# Patient Record
Sex: Female | Born: 1975 | Race: Black or African American | Hispanic: No | Smoking: Never smoker
Health system: Southern US, Community
[De-identification: ages and names within clinical notes are randomized; demographics above are authoritative.]

## PROBLEM LIST (undated history)

## (undated) DIAGNOSIS — J189 Pneumonia, unspecified organism: Secondary | ICD-10-CM

## (undated) DIAGNOSIS — I10 Essential (primary) hypertension: Secondary | ICD-10-CM

## (undated) DIAGNOSIS — J45909 Unspecified asthma, uncomplicated: Secondary | ICD-10-CM

## (undated) HISTORY — DX: Pneumonia, unspecified organism: J18.9

## (undated) HISTORY — DX: Unspecified asthma, uncomplicated: J45.909

## (undated) HISTORY — PX: TUBAL LIGATION: SHX77

---

## 2011-05-07 ENCOUNTER — Encounter (HOSPITAL_BASED_OUTPATIENT_CLINIC_OR_DEPARTMENT_OTHER): Payer: Self-pay | Admitting: *Deleted

## 2011-05-07 ENCOUNTER — Emergency Department (HOSPITAL_BASED_OUTPATIENT_CLINIC_OR_DEPARTMENT_OTHER)
Admission: EM | Admit: 2011-05-07 | Discharge: 2011-05-07 | Disposition: A | Discharge status: Home / Self Care | Payer: Self-pay | Attending: Emergency Medicine | Admitting: Emergency Medicine

## 2011-05-07 DIAGNOSIS — H538 Other visual disturbances: Secondary | ICD-10-CM | POA: Insufficient documentation

## 2011-05-07 DIAGNOSIS — R209 Unspecified disturbances of skin sensation: Secondary | ICD-10-CM | POA: Insufficient documentation

## 2011-05-07 DIAGNOSIS — R51 Headache: Secondary | ICD-10-CM | POA: Insufficient documentation

## 2011-05-07 DIAGNOSIS — I10 Essential (primary) hypertension: Secondary | ICD-10-CM | POA: Insufficient documentation

## 2011-05-07 HISTORY — DX: Essential (primary) hypertension: I10

## 2011-05-07 MED ORDER — HYDROCHLOROTHIAZIDE 25 MG PO TABS: 25.0000 mg | ORAL_TABLET | Freq: Every day | ORAL | Status: DC

## 2011-05-07 MED ORDER — HYDROCHLOROTHIAZIDE 25 MG PO TABS
25.0000 mg | ORAL_TABLET | Freq: Once | ORAL | Status: AC
  Administered 2011-05-07: 25 mg via ORAL
  Filled 2011-05-07: qty 1

## 2011-05-07 NOTE — ED Notes (Signed)
Pt states that her right arm and hand are "kinda tingly" and she has a headache and her vision "is a tad blurry" states that she took her BP at the pharmacy and it was elevated

## 2011-05-07 NOTE — ED Notes (Signed)
rx x 1 given for HCTZ- d/c with friend

## 2011-05-07 NOTE — Discharge Instructions (Signed)
Hypertension Information As your heart beats, it forces blood through your arteries. This force is your blood pressure. If the pressure is too high, it is called hypertension (HTN) or high blood pressure. HTN is dangerous because you may have it and not know it. High blood pressure may mean that your heart has to work harder to pump blood. Your arteries may be narrow or stiff. The extra work puts you at risk for heart disease, stroke, and other problems.  Blood pressure consists of two numbers, a higher number over a lower, 110/72, for example. It is stated as "110 over 72." The ideal is below 120 for the top number (systolic) and under 80 for the bottom (diastolic).  You should pay close attention to your blood pressure if you have certain conditions such as:  Heart failure.   Prior heart attack.   Diabetes   Chronic kidney disease.   Prior stroke.   Multiple risk factors for heart disease.  To see if you have HTN, your blood pressure should be measured while you are seated with your arm held at the level of the heart. It should be measured at least twice. A one-time elevated blood pressure reading (especially in the Emergency Department) does not mean that you need treatment. There may be conditions in which the blood pressure is different between your right and left arms. It is important to see your caregiver soon for a recheck. Most people have essential hypertension which means that there is not a specific cause. This type of high blood pressure may be lowered by changing lifestyle factors such as:  Stress.   Smoking.   Lack of exercise.   Excessive weight.   Drug/tobacco/alcohol use.   Eating less salt.  Most people do not have symptoms from high blood pressure until it has caused damage to the body. Effective treatment can often prevent, delay or reduce that damage. TREATMENT  Treatment for high blood pressure, when a cause has been identified, is directed at the cause. There  are a large number of medications to treat HTN. These fall into several categories, and your caregiver will help you select the medicines that are best for you. Medications may have side effects. You should review side effects with your caregiver. If your blood pressure stays high after you have made lifestyle changes or started on medicines,   Your medication(s) may need to be changed.   Other problems may need to be addressed.   Be certain you understand your prescriptions, and know how and when to take your medicine.   Be sure to follow up with your caregiver within the time frame advised (usually within two weeks) to have your blood pressure rechecked and to review your medications.   If you are taking more than one medicine to lower your blood pressure, make sure you know how and at what times they should be taken. Taking two medicines at the same time can result in blood pressure that is too low.  Document Released: 04/19/2005 Document Revised: 10/27/2010 Document Reviewed: 04/26/2007 ExitCare Patient Information 2012 [employer]. 

## 2011-05-07 NOTE — ED Provider Notes (Signed)
This chart was scribed for [name], MD by Wallis Mart. The patient was seen in room MH10/MH10 and the patient's care was started at 10:06 PM.   CSN: [id number]  Arrival date & time 05/07/11  2102   First MD Initiated Contact with Patient 05/07/11 2147      Chief Complaint  Patient presents with  . Blurred Vision  . Headache  . Hypertension    (Consider location/radiation/quality/duration/timing/severity/associated sxs/prior treatment) HPI   Nicole Larson is a 36 y.o. female who presents to the Emergency Department complaining of sudden onset, persistence of constant Hypertension . BP is currently 180/103.  Pt states that her hands started feeling tingly and her vision was blurry and she went to [facility] to check her blood pressure and it was elevated.  Pt c/o associated headache.   Pt was on blood pressure medicine a year ago but stopped because she lost her insurance.  Pt denies fever, nausea, throwing up.  There are no other associated symptoms and no other alleviating or aggravating factors.  Past Medical History  Diagnosis Date  . Hypertension     Past Surgical History  Procedure Date  . Tubal ligation     History reviewed. No pertinent family history.  History  Substance Use Topics  . Smoking status: Never Smoker   . Smokeless tobacco: Not on file  . Alcohol Use: No    OB History    Grav Para Term Preterm Abortions TAB SAB Ect Mult Living                  Review of Systems  Constitutional: Negative for fever and chills.  HENT: Negative for rhinorrhea.   Eyes: Positive for visual disturbance. Negative for pain.  Respiratory: Negative for cough and shortness of breath.   Cardiovascular: Negative for chest pain.  Gastrointestinal: Negative for nausea, vomiting, abdominal pain and diarrhea.  Genitourinary: Negative for dysuria.  Musculoskeletal: Negative for back pain.  Skin: Negative for rash.  Neurological: Positive for headaches. Negative for  weakness.  All other systems reviewed and are negative.    Allergies  Review of patient's allergies indicates no known allergies.  Home Medications   Current Outpatient Rx  Name Route Sig Dispense Refill  . CLOTRIMAZOLE 1 % EX CREA Topical Apply 1 application topically daily.    Marland Kitchen OVER THE COUNTER MEDICATION Oral Take 1 scoop by mouth daily. [employer] supplement      BP 180/103  Pulse 107  Temp 99.4 F (37.4 C)  Resp 16  LMP 04/30/2011  Physical Exam  Nursing note and vitals reviewed. Constitutional: She is oriented to person, place, and time. She appears well-developed and well-nourished. No distress.  HENT:  Head: Normocephalic and atraumatic.  Eyes: EOM are normal. Pupils are equal, round, and reactive to light.  Neck: Normal range of motion. Neck supple. No tracheal deviation present.  Cardiovascular: Normal rate and regular rhythm.   Pulmonary/Chest: Effort normal and breath sounds normal. No respiratory distress.  Abdominal: Soft. Bowel sounds are normal. She exhibits no distension.  Musculoskeletal: Normal range of motion. She exhibits no edema.  Neurological: She is alert and oriented to person, place, and time. No sensory deficit.       Cranial nerves iii-XII in-tact, extra olcular eye movements in-tact, face is symmetric, tongue is midline, visual fields in-tact, noormal finger nose testing, no pronator drift, normal leg strength bilaterally, normal heel to shin testing, speech clear, normal gait  Skin: Skin is warm and dry.  Psychiatric: She has a normal mood and affect. Her behavior is normal.    ED Course  Procedures (including critical care time) DIAGNOSTIC STUDIES:   COORDINATION OF CARE:    Labs Reviewed - No data to display No results found.   No diagnosis found.    MDM  Patient with no focal neurologic deficits or other signs of clinical end organ damage.  Patient is hypertensive here and was previously on any antihypertensive  medication but had ceased that when she lost her medical insurance.  I will restart the patient on hydrochlorothiazide 25 mg by mouth daily and she understands to followup in approximately one month's time with a new primary care physician.  Patient states she started new job and her insurance will kick in the next month and she'll be able to do so.  I've given her references for physicians here in this building.  She understands she may still need further blood pressure medications added to her regimen that will necessitate her followup in approximately one month's time.  I personally performed the services described in this documentation, which was scribed in my presence. The recorded information has been reviewed and considered.       [name], MD 05/07/11 2211

## 2011-06-09 ENCOUNTER — Encounter (HOSPITAL_BASED_OUTPATIENT_CLINIC_OR_DEPARTMENT_OTHER): Payer: Self-pay | Admitting: *Deleted

## 2011-06-09 ENCOUNTER — Emergency Department (HOSPITAL_BASED_OUTPATIENT_CLINIC_OR_DEPARTMENT_OTHER)
Admission: EM | Admit: 2011-06-09 | Discharge: 2011-06-09 | Disposition: A | Discharge status: Home / Self Care | Payer: Self-pay | Attending: Emergency Medicine | Admitting: Emergency Medicine

## 2011-06-09 DIAGNOSIS — N390 Urinary tract infection, site not specified: Secondary | ICD-10-CM | POA: Insufficient documentation

## 2011-06-09 DIAGNOSIS — A499 Bacterial infection, unspecified: Secondary | ICD-10-CM | POA: Insufficient documentation

## 2011-06-09 DIAGNOSIS — I10 Essential (primary) hypertension: Secondary | ICD-10-CM | POA: Insufficient documentation

## 2011-06-09 DIAGNOSIS — N898 Other specified noninflammatory disorders of vagina: Secondary | ICD-10-CM | POA: Insufficient documentation

## 2011-06-09 DIAGNOSIS — B9689 Other specified bacterial agents as the cause of diseases classified elsewhere: Secondary | ICD-10-CM | POA: Insufficient documentation

## 2011-06-09 DIAGNOSIS — N949 Unspecified condition associated with female genital organs and menstrual cycle: Secondary | ICD-10-CM | POA: Insufficient documentation

## 2011-06-09 DIAGNOSIS — N760 Acute vaginitis: Secondary | ICD-10-CM | POA: Insufficient documentation

## 2011-06-09 LAB — RPR: RPR Ser Ql: NONREACTIVE

## 2011-06-09 LAB — URINALYSIS, ROUTINE W REFLEX MICROSCOPIC
Glucose, UA: NEGATIVE mg/dL
Specific Gravity, Urine: 1.045 — ABNORMAL HIGH (ref 1.005–1.030)
Urobilinogen, UA: 1 mg/dL (ref 0.0–1.0)

## 2011-06-09 LAB — URINE MICROSCOPIC-ADD ON

## 2011-06-09 LAB — WET PREP, GENITAL
Trich, Wet Prep: NONE SEEN
Yeast Wet Prep HPF POC: NONE SEEN

## 2011-06-09 MED ORDER — METRONIDAZOLE 500 MG PO TABS: 500.0000 mg | ORAL_TABLET | Freq: Two times a day (BID) | ORAL | Status: AC

## 2011-06-09 MED ORDER — DOXYCYCLINE HYCLATE 100 MG PO CAPS: 100.0000 mg | ORAL_CAPSULE | Freq: Two times a day (BID) | ORAL | Status: AC

## 2011-06-09 NOTE — Discharge Instructions (Signed)
Bacterial Vaginosis Bacterial vaginosis (BV) is a vaginal infection where the normal balance of bacteria in the vagina is disrupted. The normal balance is then replaced by an overgrowth of certain bacteria. There are several different kinds of bacteria that can cause BV. BV is the most common vaginal infection in women of childbearing age. CAUSES   The cause of BV is not fully understood. BV develops when there is an increase or imbalance of harmful bacteria.   Some activities or behaviors can upset the normal balance of bacteria in the vagina and put women at increased risk including:   Having a new sex partner or multiple sex partners.   Douching.   Using an intrauterine device (IUD) for contraception.   It is not clear what role sexual activity plays in the development of BV. However, women that have never had sexual intercourse are rarely infected with BV.  Women do not get BV from toilet seats, bedding, swimming pools or from touching objects around them.  SYMPTOMS   Grey vaginal discharge.   A fish-like odor with discharge, especially after sexual intercourse.   Itching or burning of the vagina and vulva.   Burning or pain with urination.   Some women have no signs or symptoms at all.  DIAGNOSIS  Your caregiver must examine the vagina for signs of BV. Your caregiver will perform lab tests and look at the sample of vaginal fluid through a microscope. They will look for bacteria and abnormal cells (clue cells), a pH test higher than 4.5, and a positive amine test all associated with BV.  RISKS AND COMPLICATIONS   Pelvic inflammatory disease (PID).   Infections following gynecology surgery.   Developing HIV.   Developing herpes virus.  TREATMENT  Sometimes BV will clear up without treatment. However, all women with symptoms of BV should be treated to avoid complications, especially if gynecology surgery is planned. Female partners generally do not need to be treated. However,  BV may spread between female sex partners so treatment is helpful in preventing a recurrence of BV.   BV may be treated with antibiotics. The antibiotics come in either pill or vaginal cream forms. Either can be used with nonpregnant or pregnant women, but the recommended dosages differ. These antibiotics are not harmful to the baby.   BV can recur after treatment. If this happens, a second round of antibiotics will often be prescribed.   Treatment is important for pregnant women. If not treated, BV can cause a premature delivery, especially for a pregnant woman who had a premature birth in the past. All pregnant women who have symptoms of BV should be checked and treated.   For chronic reoccurrence of BV, treatment with a type of prescribed gel vaginally twice a week is helpful.  HOME CARE INSTRUCTIONS   Finish all medication as directed by your caregiver.   Do not have sex until treatment is completed.   Tell your sexual partner that you have a vaginal infection. They should see their caregiver and be treated if they have problems, such as a mild rash or itching.   Practice safe sex. Use condoms. Only have 1 sex partner.  PREVENTION  Basic prevention steps can help reduce the risk of upsetting the natural balance of bacteria in the vagina and developing BV:  Do not have sexual intercourse (be abstinent).   Do not douche.   Use all of the medicine prescribed for treatment of BV, even if the signs and symptoms go away.     Tell your sex partner if you have BV. That way, they can be treated, if needed, to prevent reoccurrence.  SEEK MEDICAL CARE IF:   Your symptoms are not improving after 3 days of treatment.   You have increased discharge, pain, or fever.  MAKE SURE YOU:   Understand these instructions.   Will watch your condition.   Will get help right away if you are not doing well or get worse.  FOR MORE INFORMATION  Division of STD Prevention (DSTDP), Centers for Disease  Control and Prevention: www.cdc.gov/std American Social Health Association (ASHA): www.ashastd.org  Document Released: 02/14/2005 Document Revised: 02/03/2011 Document Reviewed: 08/07/2008 ExitCare Patient Information 2012 [employer].Urinary Tract Infection Infections of the urinary tract can start in several places. A bladder infection (cystitis), a kidney infection (pyelonephritis), and a prostate infection (prostatitis) are different types of urinary tract infections (UTIs). They usually get better if treated with medicines (antibiotics) that kill germs. Take all the medicine until it is gone. You or your child may feel better in a few days, but TAKE ALL MEDICINE or the infection may not respond and may become more difficult to treat. HOME CARE INSTRUCTIONS   Drink enough water and fluids to keep the urine clear or pale yellow. Cranberry juice is especially recommended, in addition to large amounts of water.   Avoid caffeine, tea, and carbonated beverages. They tend to irritate the bladder.   Alcohol may irritate the prostate.   Only take over-the-counter or prescription medicines for pain, discomfort, or fever as directed by your caregiver.  To prevent further infections:  Empty the bladder often. Avoid holding urine for long periods of time.   After a bowel movement, women should cleanse from front to back. Use each tissue only once.   Empty the bladder before and after sexual intercourse.  FINDING OUT THE RESULTS OF YOUR TEST Not all test results are available during your visit. If your or your child's test results are not back during the visit, make an appointment with your caregiver to find out the results. Do not assume everything is normal if you have not heard from your caregiver or the medical facility. It is important for you to follow up on all test results. SEEK MEDICAL CARE IF:   There is back pain.   Your baby is older than 3 months with a rectal temperature of 100.5  F (38.1 C) or higher for more than 1 day.   Your or your child's problems (symptoms) are no better in 3 days. Return sooner if you or your child is getting worse.  SEEK IMMEDIATE MEDICAL CARE IF:   There is severe back pain or lower abdominal pain.   You or your child develops chills.   You have a fever.   Your baby is older than 3 months with a rectal temperature of 102 F (38.9 C) or higher.   Your baby is 3 months old or younger with a rectal temperature of 100.4 F (38 C) or higher.   There is nausea or vomiting.   There is continued burning or discomfort with urination.  MAKE SURE YOU:   Understand these instructions.   Will watch your condition.   Will get help right away if you are not doing well or get worse.  Document Released: 11/24/2004 Document Revised: 02/03/2011 Document Reviewed: 06/29/2006 ExitCare Patient Information 2012 [employer]. 

## 2011-06-09 NOTE — ED Notes (Signed)
Pt states she has a sore on her "privates". She describes a possible abscess between her posterior fourchette and her anus. States it started as a pimple that was sore and has opened, drained and is now an open area. She is tearful and states she has never been unfaithful to her husband but thinks she has an STD. Denies vaginal discharge. Also is concerned that she works and makes to much money to be on [employer] but cannot get into a GYN for exams due to no insurance and she has not had a pelvic exam in 6 years. The health department does not see woman who have had their tubes tied.

## 2011-06-09 NOTE — ED Provider Notes (Signed)
Medical screening examination/treatment/procedure(s) were performed by non-physician practitioner and as supervising physician I was immediately available for consultation/collaboration.   Lyanne Co, MD 06/09/11 [phone]

## 2011-06-09 NOTE — ED Provider Notes (Signed)
History     CSN: [id number]  Arrival date & time 06/09/11  1159   First MD Initiated Contact with Patient 06/09/11 1214      Chief Complaint  Patient presents with  . Abscess    (Consider location/radiation/quality/duration/timing/severity/associated sxs/prior treatment) Patient is a 36 y.o. female presenting with vaginal discharge. The history is provided by the patient. No language interpreter was used.  Vaginal Discharge This is a new problem. The current episode started today. The problem occurs constantly. The problem has been gradually worsening. Associated symptoms comments: Vaginal pain. The symptoms are aggravated by nothing. She has tried nothing for the symptoms. The treatment provided moderate relief.  Pt complains of a sore on inner labia,  Pt reports area is very painful  Past Medical History  Diagnosis Date  . Hypertension     Past Surgical History  Procedure Date  . Tubal ligation     No family history on file.  History  Substance Use Topics  . Smoking status: Never Smoker   . Smokeless tobacco: Not on file  . Alcohol Use: No    OB History    Grav Para Term Preterm Abortions TAB SAB Ect Mult Living                  Review of Systems  Genitourinary: Positive for vaginal discharge.  All other systems reviewed and are negative.    Allergies  Review of patient's allergies indicates no known allergies.  Home Medications   Current Outpatient Rx  Name Route Sig Dispense Refill  . CLOTRIMAZOLE 1 % EX CREA Topical Apply 1 application topically daily.    Marland Kitchen HYDROCHLOROTHIAZIDE 25 MG PO TABS Oral Take 1 tablet (25 mg total) by mouth daily. 30 tablet 0  . OVER THE COUNTER MEDICATION Oral Take 1 scoop by mouth daily. [employer] supplement      BP 145/95  Pulse 94  Temp(Src) 98.2 F (36.8 C) (Oral)  Resp 20  SpO2 99%  LMP 05/23/2011  Physical Exam  Nursing note and vitals reviewed. Constitutional: She is oriented to person, place, and  time. She appears well-developed and well-nourished.  HENT:  Head: Normocephalic.  Eyes: Conjunctivae are normal. Pupils are equal, round, and reactive to light.  Neck: Normal range of motion.  Cardiovascular: Normal rate and normal heart sounds.   Pulmonary/Chest: Effort normal.  Abdominal: Soft.  Genitourinary: Vaginal discharge found.  Musculoskeletal: Normal range of motion.       Pink lesion left labia,  Painful to touch  Neurological: She is alert and oriented to person, place, and time. She has normal reflexes.  Skin: Skin is warm.  Psychiatric: She has a normal mood and affect.    ED Course  Procedures (including critical care time)   Labs Reviewed  URINALYSIS, ROUTINE W REFLEX MICROSCOPIC  WET PREP, GENITAL  GC/CHLAMYDIA PROBE AMP, GENITAL  HERPES SIMPLEX VIRUS CULTURE  RPR   No results found.   No diagnosis found.    MDM  Pt covered for uti and bv.  I will use flagyl and doxy.   I advised pt she needs gyn follow up       Elson Areas, [place] 06/09/11 1412

## 2011-06-10 LAB — GC/CHLAMYDIA PROBE AMP, GENITAL: GC Probe Amp, Genital: NEGATIVE

## 2011-06-13 LAB — HERPES SIMPLEX VIRUS CULTURE: Culture: DETECTED

## 2011-06-14 NOTE — ED Notes (Signed)
+  Herpes. Chart sent to EDP office for review. °

## 2011-06-16 NOTE — ED Notes (Signed)
Chart back from EDP office; no answer

## 2011-06-19 NOTE — ED Notes (Signed)
Chart back from EDP office. Herpes Simplex type II detected. Recommend Valtrex 500mg  PO BID x 3 days IF patient has prior RX of herpes. Reviewed/Prescribed by Fayrene Helper PA-C.

## 2011-06-19 NOTE — ED Notes (Signed)
Patient notified of positive result, after ID verified x two, STD instructions given. RX Valtre 500 mg PO BID x three days, prescriber Fayrene Helper PA, called to [facility] [phone].

## 2013-05-02 ENCOUNTER — Encounter (HOSPITAL_BASED_OUTPATIENT_CLINIC_OR_DEPARTMENT_OTHER): Payer: Self-pay | Admitting: Emergency Medicine

## 2013-05-02 ENCOUNTER — Emergency Department (HOSPITAL_BASED_OUTPATIENT_CLINIC_OR_DEPARTMENT_OTHER)
Admission: EM | Admit: 2013-05-02 | Discharge: 2013-05-02 | Disposition: A | Discharge status: Home / Self Care | Payer: BC Managed Care – PPO | Attending: Emergency Medicine | Admitting: Emergency Medicine

## 2013-05-02 DIAGNOSIS — K006 Disturbances in tooth eruption: Secondary | ICD-10-CM | POA: Insufficient documentation

## 2013-05-02 DIAGNOSIS — K029 Dental caries, unspecified: Secondary | ICD-10-CM | POA: Insufficient documentation

## 2013-05-02 DIAGNOSIS — Z79899 Other long term (current) drug therapy: Secondary | ICD-10-CM | POA: Insufficient documentation

## 2013-05-02 DIAGNOSIS — I10 Essential (primary) hypertension: Secondary | ICD-10-CM | POA: Insufficient documentation

## 2013-05-02 MED ORDER — PENICILLIN V POTASSIUM 500 MG PO TABS: 500.0000 mg | ORAL_TABLET | Freq: Four times a day (QID) | ORAL | Status: DC

## 2013-05-02 MED ORDER — HYDROCODONE-ACETAMINOPHEN 5-325 MG PO TABS: 1.0000 | ORAL_TABLET | Freq: Four times a day (QID) | ORAL | Status: DC | PRN

## 2013-05-02 MED ORDER — PENICILLIN V POTASSIUM 250 MG PO TABS
500.0000 mg | ORAL_TABLET | Freq: Once | ORAL | Status: AC
  Administered 2013-05-02: 500 mg via ORAL
  Filled 2013-05-02: qty 2

## 2013-05-02 NOTE — ED Notes (Signed)
C/o pain to top left back tooth since yesterday

## 2013-05-02 NOTE — Discharge Instructions (Signed)
Dental Care and Dentist Visits Dental care supports good overall health. Regular dental visits can also help you avoid dental pain, bleeding, infection, and other more serious health problems in the future. It is important to keep the mouth healthy because diseases in the teeth, gums, and other oral tissues can spread to other areas of the body. Some problems, such as diabetes, heart disease, and pre-term labor have been associated with poor oral health.  See your dentist every 6 months. If you experience emergency problems such as a toothache or broken tooth, go to the dentist right away. If you see your dentist regularly, you may catch problems early. It is easier to be treated for problems in the early stages.  WHAT TO EXPECT AT A DENTIST VISIT  Your dentist will look for many common oral health problems and recommend proper treatment. At your regular dental visit, you can expect:  Gentle cleaning of the teeth and gums. This includes scraping and polishing. This helps to remove the sticky substance around the teeth and gums (plaque). Plaque forms in the mouth shortly after eating. Over time, plaque hardens on the teeth as tartar. If tartar is not removed regularly, it can cause problems. Cleaning also helps remove stains.  Periodic X-rays. These pictures of the teeth and supporting bone will help your dentist assess the health of your teeth.  Periodic fluoride treatments. Fluoride is a natural mineral shown to help strengthen teeth. Fluoride treatmentinvolves applying a fluoride gel or varnish to the teeth. It is most commonly done in children.  Examination of the mouth, tongue, jaws, teeth, and gums to look for any oral health problems, such as:  Cavities (dental caries). This is decay on the tooth caused by plaque, sugar, and acid in the mouth. It is best to catch a cavity when it is small.  Inflammation of the gums caused by plaque buildup (gingivitis).  Problems with the mouth or malformed  or misaligned teeth.  Oral cancer or other diseases of the soft tissues or jaws. KEEP YOUR TEETH AND GUMS HEALTHY For healthy teeth and gums, follow these general guidelines as well as your dentist's specific advice:  Have your teeth professionally cleaned at the dentist every 6 months.  Brush twice daily with a fluoride toothpaste.  Floss your teeth daily.  Ask your dentist if you need fluoride supplements, treatments, or fluoride toothpaste.  Eat a healthy diet. Reduce foods and drinks with added sugar.  Avoid smoking. TREATMENT FOR ORAL HEALTH PROBLEMS If you have oral health problems, treatment varies depending on the conditions present in your teeth and gums.  Your caregiver will most likely recommend good oral hygiene at each visit.  For cavities, gingivitis, or other oral health disease, your caregiver will perform a procedure to treat the problem. This is typically done at a separate appointment. Sometimes your caregiver will refer you to another dental specialist for specific tooth problems or for surgery. SEEK IMMEDIATE DENTAL CARE IF:  You have pain, bleeding, or soreness in the gum, tooth, jaw, or mouth area.  A permanent tooth becomes loose or separated from the gum socket.  You experience a blow or injury to the mouth or jaw area. Document Released: 10/27/2010 Document Revised: 05/09/2011 Document Reviewed: 10/27/2010 ExitCare Patient Information 2014 [employer].  

## 2013-05-02 NOTE — ED Provider Notes (Signed)
CSN: [id number]     Arrival date & time 05/02/13  7116 History   None    Chief Complaint  Patient presents with  . Dental Pain     (Consider location/radiation/quality/duration/timing/severity/associated sxs/prior Treatment) Patient is a 38 y.o. female presenting with tooth pain. The history is provided by the patient.  Dental Pain Location:  Upper Upper teeth location:  14/LU 1st molar Quality:  Dull Severity:  Severe Duration:  1 day Timing:  Constant Progression:  Unchanged Chronicity:  New Context: poor dentition   Previous work-up:  Dental exam Relieved by:  Nothing Worsened by:  Nothing tried Ineffective treatments:  None tried Associated symptoms: no fever and no neck swelling   Risk factors: no smoking     Past Medical History  Diagnosis Date  . Hypertension    Past Surgical History  Procedure Laterality Date  . Tubal ligation     History reviewed. No pertinent family history. History  Substance Use Topics  . Smoking status: Never Smoker   . Smokeless tobacco: Not on file  . Alcohol Use: No   OB History   Grav Para Term Preterm Abortions TAB SAB Ect Mult Living                 Review of Systems  Constitutional: Negative for fever.  All other systems reviewed and are negative.      Allergies  Review of patient's allergies indicates no known allergies.  Home Medications   Current Outpatient Rx  Name  Route  Sig  Dispense  Refill  . clotrimazole (LOTRIMIN) 1 % cream   Topical   Apply 1 application topically daily.         Marland Kitchen HYDROcodone-acetaminophen (NORCO) 5-325 MG per tablet   Oral   Take 1 tablet by mouth every 6 (six) hours as needed.   10 tablet   0   . OVER THE COUNTER MEDICATION   Oral   Take 1 scoop by mouth daily. [employer] supplement         . penicillin v potassium (VEETID) 500 MG tablet   Oral   Take 1 tablet (500 mg total) by mouth 4 (four) times daily.   28 tablet   0    BP 160/95  Pulse 92  Temp(Src)  98.6 F (37 C) (Oral)  Resp 18  SpO2 100%  LMP 05/02/2013 Physical Exam  Constitutional: She is oriented to person, place, and time. She appears well-developed and well-nourished. No distress.  HENT:  Head: Normocephalic and atraumatic.  Mouth/Throat: Oropharynx is clear and moist.    Eyes: Conjunctivae and EOM are normal.  Neck: Normal range of motion.  Cardiovascular: Normal rate and regular rhythm.   Pulmonary/Chest: Effort normal and breath sounds normal. She has no wheezes. She has no rales.  Abdominal: Soft. Bowel sounds are normal. There is no tenderness. There is no rebound and no guarding.  Musculoskeletal: Normal range of motion.  Lymphadenopathy:    She has no cervical adenopathy.  Neurological: She is alert and oriented to person, place, and time.  Skin: Skin is warm and dry.  Psychiatric: She has a normal mood and affect.    ED Course  Procedures (including critical care time) Labs Review Labs Reviewed - No data to display Imaging Review No results found.   EKG Interpretation None      MDM   Final diagnoses:  Dental caries    Penicillin, pain medication and referral to a local dentist for further dental  care    Zyrion Coey K Milayna Rotenberg-Rasch, MD 05/02/13 [phone]

## 2013-06-21 ENCOUNTER — Emergency Department (HOSPITAL_BASED_OUTPATIENT_CLINIC_OR_DEPARTMENT_OTHER): Payer: BC Managed Care – PPO

## 2013-06-21 ENCOUNTER — Emergency Department (HOSPITAL_BASED_OUTPATIENT_CLINIC_OR_DEPARTMENT_OTHER)
Admission: EM | Admit: 2013-06-21 | Discharge: 2013-06-21 | Disposition: A | Discharge status: Home / Self Care | Payer: BC Managed Care – PPO | Attending: Emergency Medicine | Admitting: Emergency Medicine

## 2013-06-21 ENCOUNTER — Encounter (HOSPITAL_BASED_OUTPATIENT_CLINIC_OR_DEPARTMENT_OTHER): Payer: Self-pay | Admitting: Emergency Medicine

## 2013-06-21 DIAGNOSIS — W010XXA Fall on same level from slipping, tripping and stumbling without subsequent striking against object, initial encounter: Secondary | ICD-10-CM | POA: Insufficient documentation

## 2013-06-21 DIAGNOSIS — Z792 Long term (current) use of antibiotics: Secondary | ICD-10-CM | POA: Insufficient documentation

## 2013-06-21 DIAGNOSIS — Z79899 Other long term (current) drug therapy: Secondary | ICD-10-CM | POA: Insufficient documentation

## 2013-06-21 DIAGNOSIS — I10 Essential (primary) hypertension: Secondary | ICD-10-CM | POA: Insufficient documentation

## 2013-06-21 DIAGNOSIS — Y939 Activity, unspecified: Secondary | ICD-10-CM | POA: Insufficient documentation

## 2013-06-21 DIAGNOSIS — Y929 Unspecified place or not applicable: Secondary | ICD-10-CM | POA: Insufficient documentation

## 2013-06-21 DIAGNOSIS — S93409A Sprain of unspecified ligament of unspecified ankle, initial encounter: Secondary | ICD-10-CM | POA: Insufficient documentation

## 2013-06-21 NOTE — ED Provider Notes (Signed)
CSN: [id number]     Arrival date & time 06/21/13  2103 History  This chart was scribed for Malyn Aytes B. Karle Starch, MD by Mercy Moore, ED scribe.  This patient was seen in room MH10/MH10 and the patient's care was started at 10:30 PM.   Chief Complaint  Patient presents with  . Ankle Injury      The history is provided by the patient. No language interpreter was used.   HPI Comments: Nicole Larson is a 38 y.o. female who presents to the Emergency Department complaining of right ankle pain and swelling after tripping over her grandchild's toy tonight. Patient is unsure if she twisted the ankle. She locates her pain over the top of the ankle.  Past Medical History  Diagnosis Date  . Hypertension    Past Surgical History  Procedure Laterality Date  . Tubal ligation     No family history on file. History  Substance Use Topics  . Smoking status: Never Smoker   . Smokeless tobacco: Not on file  . Alcohol Use: No   OB History   Grav Para Term Preterm Abortions TAB SAB Ect Mult Living                 Review of Systems A complete 10 system review of systems was obtained and all systems are negative except as noted in the HPI and PMH.     Allergies  Review of patient's allergies indicates no known allergies.  Home Medications   Prior to Admission medications   Medication Sig Start Date End Date Taking? Authorizing Provider  clotrimazole (LOTRIMIN) 1 % cream Apply 1 application topically daily.    Historical Provider, MD  HYDROcodone-acetaminophen (NORCO) 5-325 MG per tablet Take 1 tablet by mouth every 6 (six) hours as needed. 05/02/13   April Alfonso Patten, MD  OVER THE COUNTER MEDICATION Take 1 scoop by mouth daily. [employer] supplement    Historical Provider, MD  penicillin v potassium (VEETID) 500 MG tablet Take 1 tablet (500 mg total) by mouth 4 (four) times daily. 05/02/13   April K Palumbo-Rasch, MD   Triage Vitals: BP 147/104  Pulse 98  Temp(Src) 98.6 F (37 C)  (Oral)  Resp 18  Ht 5\' 2"  (1.575 m)  Wt 145 lb (65.772 kg)  BMI 26.51 kg/m2  SpO2 100%  LMP 05/31/2013 Physical Exam  Constitutional: She is oriented to person, place, and time. She appears well-developed and well-nourished.  HENT:  Head: Normocephalic and atraumatic.  Neck: Neck supple.  Pulmonary/Chest: Effort normal.  Musculoskeletal: She exhibits tenderness (dorsal R foot, no bony tenderness).  Neurological: She is alert and oriented to person, place, and time. No cranial nerve deficit.  Psychiatric: She has a normal mood and affect. Her behavior is normal.    ED Course  Procedures (including critical care time) DIAGNOSTIC STUDIES: Oxygen Saturation is 100% on room air, normal by my interpretation.    COORDINATION OF CARE: 10:31 PM- Will order brace and crutches. Discussed treatment plan with patient at bedside and patient agreed to plan.     Labs Review Labs Reviewed - No data to display  Imaging Review Dg Ankle Complete Right  06/21/2013   CLINICAL DATA:  Tripped over toys, with anterior and lateral right ankle pain.  EXAM: RIGHT ANKLE - COMPLETE 3+ VIEW  COMPARISON:  None.  FINDINGS: There is no evidence of fracture or dislocation. The ankle mortise is intact; the interosseous space is within normal limits. No talar tilt or subluxation  is seen.  The joint spaces are preserved. Mild dorsal soft tissue swelling is noted at the ankle.  IMPRESSION: No evidence of fracture or dislocation.   Electronically Signed   By: Garald Balding M.D.   On: 06/21/2013 21:44     EKG Interpretation None      MDM   Final diagnoses:  Ankle sprain    I personally performed the services described in this documentation, which was scribed in my presence. The recorded information has been reviewed and is accurate.     Haden Cavenaugh B. Karle Starch, MD 06/21/13 2244

## 2013-06-21 NOTE — ED Notes (Signed)
Tripped on toys and fell tonight. Swelling and pain to her right ankle.

## 2013-06-21 NOTE — Discharge Instructions (Signed)

## 2014-03-30 ENCOUNTER — Emergency Department (HOSPITAL_BASED_OUTPATIENT_CLINIC_OR_DEPARTMENT_OTHER)
Admission: EM | Admit: 2014-03-30 | Discharge: 2014-03-31 | Disposition: A | Discharge status: Home / Self Care | Payer: Self-pay | Attending: Emergency Medicine | Admitting: Emergency Medicine

## 2014-03-30 ENCOUNTER — Emergency Department (HOSPITAL_BASED_OUTPATIENT_CLINIC_OR_DEPARTMENT_OTHER): Payer: Self-pay

## 2014-03-30 ENCOUNTER — Encounter (HOSPITAL_BASED_OUTPATIENT_CLINIC_OR_DEPARTMENT_OTHER): Payer: Self-pay | Admitting: Emergency Medicine

## 2014-03-30 DIAGNOSIS — I10 Essential (primary) hypertension: Secondary | ICD-10-CM | POA: Insufficient documentation

## 2014-03-30 DIAGNOSIS — J029 Acute pharyngitis, unspecified: Secondary | ICD-10-CM | POA: Insufficient documentation

## 2014-03-30 LAB — RAPID STREP SCREEN (MED CTR MEBANE ONLY): Streptococcus, Group A Screen (Direct): NEGATIVE

## 2014-03-30 MED ORDER — SODIUM CHLORIDE 0.9 % IV BOLUS (SEPSIS)
1000.0000 mL | Freq: Once | INTRAVENOUS | Status: AC
  Administered 2014-03-31: 1000 mL via INTRAVENOUS

## 2014-03-30 MED ORDER — DEXAMETHASONE SODIUM PHOSPHATE 10 MG/ML IJ SOLN
10.0000 mg | Freq: Once | INTRAMUSCULAR | Status: AC
  Administered 2014-03-31: 10 mg via INTRAVENOUS
  Filled 2014-03-30: qty 1

## 2014-03-30 MED ORDER — IBUPROFEN 400 MG PO TABS
400.0000 mg | ORAL_TABLET | Freq: Once | ORAL | Status: AC
  Administered 2014-03-31: 400 mg via ORAL
  Filled 2014-03-30: qty 1

## 2014-03-30 NOTE — ED Provider Notes (Signed)
CSN: [id number]     Arrival date & time 03/30/14  1949 History  This chart was scribed for non-physician practitioner, Monico Blitz, PA-C working with Arbie Cookey,  by Tula Nakayama, ED scribe. This patient was seen in room MH11/MH11 and the patient's care was started at 10:54 PM  Chief Complaint  Patient presents with  . Sore Throat   The history is provided by the patient. No language interpreter was used.    HPI Comments: Nicole Larson is a 39 y.o. female with a history of HTN who presents to the Emergency Department complaining of constant sore throat and fever that started today. She states difficulty swallowing as an associated symptom. Pt notes that her pain is worse on the right side. She has tried an unspecified prescription pain medication with no relief. Pt denies cough.  Past Medical History  Diagnosis Date  . Hypertension    Past Surgical History  Procedure Laterality Date  . Tubal ligation     No family history on file. History  Substance Use Topics  . Smoking status: Never Smoker   . Smokeless tobacco: Not on file  . Alcohol Use: No   OB History    No data available     Review of Systems  10 Systems reviewed and all are negative for acute change except as noted in the HPI.   Allergies  Review of patient's allergies indicates no known allergies.  Home Medications   Prior to Admission medications   Not on File   BP 174/115 mmHg  Pulse 112  Temp(Src) 100.9 F (38.3 C) (Oral)  Resp 18  Ht 5\' 2"  (1.575 m)  Wt 150 lb (68.04 kg)  BMI 27.43 kg/m2  SpO2 100% Physical Exam  Constitutional: She is oriented to person, place, and time. She appears well-developed and well-nourished. No distress.  + Dysphonia  HENT:  Head: Normocephalic and atraumatic.  Significant bilateral hypertrophy 3+, it is worse on the right. There is exudate on the right side. Uvula is midline, there is a fullness over the right soft palate, no overt peritonsillar  abscess.  Patient is handling her secretions without issue.  Eyes: Conjunctivae and EOM are normal. Pupils are equal, round, and reactive to light.  Neck: Normal range of motion.  Tender anterior cervical lymphadenopathy worse on the right than the left.  Cardiovascular: Normal rate, regular rhythm and intact distal pulses.   Mild tachycardia  Pulmonary/Chest: Effort normal and breath sounds normal. No stridor. No respiratory distress. She has no wheezes. She has no rales. She exhibits no tenderness.  Abdominal: Soft. She exhibits no distension and no mass. There is no tenderness. There is no rebound and no guarding.  Musculoskeletal: Normal range of motion.  Lymphadenopathy:    She has cervical adenopathy.  Neurological: She is alert and oriented to person, place, and time.  Psychiatric: She has a normal mood and affect.  Nursing note and vitals reviewed.   ED Course  Procedures (including critical care time) DIAGNOSTIC STUDIES: Oxygen Saturation is 100% on RA, normal by my interpretation.    COORDINATION OF CARE: 10:54 PM Discussed treatment plan with pt at bedside and pt agreed to plan.  Labs Review Labs Reviewed  BASIC METABOLIC PANEL - Abnormal; Notable for the following:    Glucose, Bld 102 (*)    GFR calc non Af Amer 69 (*)    GFR calc Af Amer 80 (*)    Anion gap <3 (*)    All other  components within normal limits  CBC WITH DIFFERENTIAL/PLATELET - Abnormal; Notable for the following:    WBC 11.6 (*)    Hemoglobin 11.7 (*)    HCT 33.1 (*)    Neutrophils Relative % 81 (*)    Neutro Abs 9.4 (*)    All other components within normal limits  RAPID STREP SCREEN  CULTURE, GROUP A STREP  PREGNANCY, URINE  MONONUCLEOSIS SCREEN    Imaging Review No results found.   EKG Interpretation None      MDM   Final diagnoses:  None     Filed Vitals:   03/30/14 1958 03/30/14 1959  BP:  174/115  Pulse: 112   Temp: 100.9 F (38.3 C)   TempSrc: Oral   Resp: 18    Height: 5\' 2"  (1.575 m)   Weight: 150 lb (68.04 kg)   SpO2: 100%     Medications  sodium chloride 0.9 % bolus 1,000 mL (not administered)  dexamethasone (DECADRON) injection 10 mg (not administered)  ibuprofen (ADVIL,MOTRIN) tablet 400 mg (not administered)    Nicole Larson is a pleasant 39 y.o. female presenting with fever and sore throat onset today. Patient has positive dysphonia, there is significant tonsillar hypertrophy on the right greater than left. There is a slight fullness on the right soft palate, will obtain blood work and CT to rule out peritonsillar abscess.  Dr. Florina Ou will follow-up CT scan and bloodwork and dispo appropriately.  I personally performed the services described in this documentation, which was scribed in my presence. The recorded information has been reviewed and is accurate.      Monico Blitz, PA-C 03/31/14 [id number]  Wynetta Fines, MD 03/31/14 [phone]

## 2014-03-30 NOTE — ED Notes (Signed)
Pt is out of BP medication.

## 2014-03-30 NOTE — ED Notes (Signed)
Pt c/o sore throat and fever starting today

## 2014-03-31 LAB — BASIC METABOLIC PANEL
BUN: 15 mg/dL (ref 6–23)
CALCIUM: 8.5 mg/dL (ref 8.4–10.5)
CO2: 26 mmol/L (ref 19–32)
CREATININE: 1.03 mg/dL (ref 0.50–1.10)
Chloride: 108 mmol/L (ref 96–112)
GFR calc Af Amer: 80 mL/min — ABNORMAL LOW (ref >90)
GFR calc non Af Amer: 69 mL/min — ABNORMAL LOW (ref >90)
Glucose, Bld: 102 mg/dL — ABNORMAL HIGH (ref 70–99)
POTASSIUM: 3.5 mmol/L (ref 3.5–5.1)
SODIUM: 135 mmol/L (ref 135–145)

## 2014-03-31 LAB — MONONUCLEOSIS SCREEN: MONO SCREEN: NEGATIVE

## 2014-03-31 LAB — CBC WITH DIFFERENTIAL/PLATELET
Basophils Absolute: 0 10*3/uL (ref 0.0–0.1)
Basophils Relative: 0 % (ref 0–1)
EOS PCT: 0 % (ref 0–5)
Eosinophils Absolute: 0 10*3/uL (ref 0.0–0.7)
HEMATOCRIT: 33.1 % — AB (ref 36.0–46.0)
HEMOGLOBIN: 11.7 g/dL — AB (ref 12.0–15.0)
LYMPHS ABS: 1.5 10*3/uL (ref 0.7–4.0)
Lymphocytes Relative: 13 % (ref 12–46)
MCH: 28.1 pg (ref 26.0–34.0)
MCHC: 35.3 g/dL (ref 30.0–36.0)
MCV: 79.4 fL (ref 78.0–100.0)
MONOS PCT: 6 % (ref 3–12)
Monocytes Absolute: 0.7 10*3/uL (ref 0.1–1.0)
NEUTROS ABS: 9.4 10*3/uL — AB (ref 1.7–7.7)
NEUTROS PCT: 81 % — AB (ref 43–77)
PLATELETS: 248 10*3/uL (ref 150–400)
RBC: 4.17 MIL/uL (ref 3.87–5.11)
RDW: 13.3 % (ref 11.5–15.5)
WBC: 11.6 10*3/uL — ABNORMAL HIGH (ref 4.0–10.5)

## 2014-03-31 MED ORDER — IOHEXOL 300 MG/ML  SOLN
80.0000 mL | Freq: Once | INTRAMUSCULAR | Status: AC | PRN
  Administered 2014-03-31: 80 mL via INTRAVENOUS

## 2014-03-31 MED ORDER — HYDROCODONE-ACETAMINOPHEN 7.5-325 MG/15ML PO SOLN
ORAL | Status: DC
Start: 2014-03-31 — End: 2015-11-15

## 2014-04-01 LAB — CULTURE, GROUP A STREP

## 2014-04-02 NOTE — Progress Notes (Signed)
ED Antimicrobial Stewardship Positive Culture Follow Up   Nicole Larson is an 39 y.o. female who presented to [facility] on 03/30/2014 with a chief complaint of sore throat  Chief Complaint  Patient presents with  . Sore Throat    Recent Results (from the past 720 hour(s))  Rapid strep screen     Status: None   Collection Time: 03/30/14 10:23 PM  Result Value Ref Range Status   Streptococcus, Group A Screen (Direct) NEGATIVE NEGATIVE Final    Comment: (NOTE) A Rapid Antigen test may result negative if the antigen level in the sample is below the detection level of this test. The FDA has not cleared this test as a stand-alone test therefore the rapid antigen negative result has reflexed to a Group A Strep culture.   Culture, Group A Strep     Status: None   Collection Time: 03/30/14 10:23 PM  Result Value Ref Range Status   Specimen Description THROAT  Final   Special Requests NONE  Final   Culture   Final    STREPTOCOCCUS,BETA HEMOLYTIC NOT GROUP A Performed at [facility]    Report Status 04/01/2014 FINAL  Final    [x]  Needs additional follow-up  48 YOF who presented on 1/31 with sore throat/fever. Rapid strep was negative. Work up revealed enlarged tonsils with erythema - no abscess >> deemed viral pharyngitis. The patient grew out non-Group A Strep which could be a colonizer and nonpathogenic. Per discussion with the PA - since the tonsils were enlarged - will plan on calling for symptom check and will treat if still symptomatic.   Needs additioinal follow-up: Call for symptom check * If sore throat resolving >> no treatment needed.  * If still symptomatic >> treat with Penicillin VK 500 mg bid x 10 days  ED Provider:  Jeannett Senior, PA-C  Nicole Larson 04/02/2014, 10:49 AM Infectious Diseases Pharmacist Phone# [phone]

## 2014-04-06 ENCOUNTER — Telehealth (HOSPITAL_BASED_OUTPATIENT_CLINIC_OR_DEPARTMENT_OTHER): Payer: Self-pay | Admitting: Emergency Medicine

## 2014-04-07 ENCOUNTER — Telehealth (HOSPITAL_COMMUNITY): Payer: Self-pay

## 2014-04-12 ENCOUNTER — Encounter (HOSPITAL_BASED_OUTPATIENT_CLINIC_OR_DEPARTMENT_OTHER): Payer: Self-pay | Admitting: *Deleted

## 2014-04-12 ENCOUNTER — Emergency Department (HOSPITAL_BASED_OUTPATIENT_CLINIC_OR_DEPARTMENT_OTHER)
Admission: EM | Admit: 2014-04-12 | Discharge: 2014-04-12 | Disposition: A | Discharge status: Home / Self Care | Payer: Self-pay | Attending: Emergency Medicine | Admitting: Emergency Medicine

## 2014-04-12 DIAGNOSIS — Z79899 Other long term (current) drug therapy: Secondary | ICD-10-CM | POA: Insufficient documentation

## 2014-04-12 DIAGNOSIS — I10 Essential (primary) hypertension: Secondary | ICD-10-CM | POA: Insufficient documentation

## 2014-04-12 DIAGNOSIS — Y9289 Other specified places as the place of occurrence of the external cause: Secondary | ICD-10-CM | POA: Insufficient documentation

## 2014-04-12 DIAGNOSIS — Y998 Other external cause status: Secondary | ICD-10-CM | POA: Insufficient documentation

## 2014-04-12 DIAGNOSIS — W500XXA Accidental hit or strike by another person, initial encounter: Secondary | ICD-10-CM | POA: Insufficient documentation

## 2014-04-12 DIAGNOSIS — H209 Unspecified iridocyclitis: Secondary | ICD-10-CM | POA: Insufficient documentation

## 2014-04-12 DIAGNOSIS — S0591XA Unspecified injury of right eye and orbit, initial encounter: Secondary | ICD-10-CM | POA: Insufficient documentation

## 2014-04-12 DIAGNOSIS — Y9389 Activity, other specified: Secondary | ICD-10-CM | POA: Insufficient documentation

## 2014-04-12 MED ORDER — FLUORESCEIN SODIUM 1 MG OP STRP
ORAL_STRIP | OPHTHALMIC | Status: AC
  Administered 2014-04-12: 1 via OPHTHALMIC
  Filled 2014-04-12: qty 1

## 2014-04-12 MED ORDER — SULFACETAMIDE SODIUM 10 % OP SOLN: 1.0000 [drp] | OPHTHALMIC | Status: AC

## 2014-04-12 MED ORDER — TETRACAINE HCL 0.5 % OP SOLN
1.0000 [drp] | Freq: Once | OPHTHALMIC | Status: AC
  Administered 2014-04-12: 1 [drp] via OPHTHALMIC

## 2014-04-12 MED ORDER — FLUORESCEIN SODIUM 1 MG OP STRP
1.0000 | ORAL_STRIP | Freq: Once | OPHTHALMIC | Status: AC
  Administered 2014-04-12: 1 via OPHTHALMIC

## 2014-04-12 MED ORDER — CYCLOPENTOLATE HCL 1 % OP SOLN: 1.0000 [drp] | Freq: Three times a day (TID) | OPHTHALMIC | Status: DC

## 2014-04-12 MED ORDER — TETRACAINE HCL 0.5 % OP SOLN
OPHTHALMIC | Status: AC
  Administered 2014-04-12: 1 [drp] via OPHTHALMIC
  Filled 2014-04-12: qty 2

## 2014-04-12 NOTE — Discharge Instructions (Signed)
It is very important for you to see an ophthalmologist on Monday morning. If you have fevers, pain with eye movement or other worsening symptoms course emergency department if unable to see the eye doctor. If you were given medicines take as directed.  If you are on coumadin or contraceptives realize their levels and effectiveness is altered by many different medicines.  If you have any reaction (rash, tongues swelling, other) to the medicines stop taking and see a physician.   Please follow up as directed and return to the ER or see a physician for new or worsening symptoms.  Thank you. Filed Vitals:   04/12/14 0749  BP: 155/90  Pulse: 102  Temp: 98.6 F (37 C)  TempSrc: Oral  Resp: 18  Height: 5\' 2"  (1.575 m)  Weight: 150 lb (68.04 kg)  SpO2: 100%

## 2014-04-12 NOTE — ED Provider Notes (Signed)
CSN: [id number]     Arrival date & time 04/12/14  1607 History   First MD Initiated Contact with Patient 04/12/14 [phone]     Chief Complaint  Patient presents with  . Eye Problem     (Consider location/radiation/quality/duration/timing/severity/associated sxs/prior Treatment) HPI Comments: 39 year old female with no significant medical history, no history of eye pathology, no contact lenses presents with right eye pain since Wednesday evening. Patient was hit in the eye by her granddaughter's quality. Patient has had gradually worsening light sensitivity and burning pain since. Patient does not have pain with EOmuscle function. No significant drainage or fevers. Patient has not seen the ophthalmologist yet. Pain intermittent.  Patient is a 39 y.o. female presenting with eye problem. The history is provided by the patient.  Eye Problem Associated symptoms: photophobia and redness   Associated symptoms: no headaches and no vomiting     Past Medical History  Diagnosis Date  . Hypertension    Past Surgical History  Procedure Laterality Date  . Tubal ligation     No family history on file. History  Substance Use Topics  . Smoking status: Never Smoker   . Smokeless tobacco: Not on file  . Alcohol Use: No   OB History    No data available     Review of Systems  Constitutional: Negative for fever and chills.  Eyes: Positive for photophobia, pain and redness. Negative for visual disturbance.  Gastrointestinal: Negative for vomiting and abdominal pain.  Musculoskeletal: Negative for back pain, neck pain and neck stiffness.  Skin: Negative for rash.  Neurological: Negative for light-headedness and headaches.      Allergies  Review of patient's allergies indicates no known allergies.  Home Medications   Prior to Admission medications   Medication Sig Start Date End Date Taking? Authorizing Provider  hydrochlorothiazide (HYDRODIURIL) 25 MG tablet Take 25 mg by mouth daily.    Yes Historical Provider, MD  cyclopentolate (CYCLODRYL,CYCLOGYL) 1 % ophthalmic solution Place 1 drop into the right eye 3 (three) times daily. 04/12/14   Mariea Clonts, MD  HYDROcodone-acetaminophen (HYCET) 7.5-325 mg/15 ml solution Take 57mL every four hours as needed for pain. 03/31/14   Karen Chafe Molpus, MD  sulfacetamide (BLEPH-10) 10 % ophthalmic solution Place 1-2 drops into the right eye every 4 (four) hours. 04/12/14 04/17/14  Mariea Clonts, MD   BP 155/90 mmHg  Pulse 102  Temp(Src) 98.6 F (37 C) (Oral)  Resp 18  Ht 5\' 2"  (1.575 m)  Wt 150 lb (68.04 kg)  BMI 27.43 kg/m2  SpO2 100% Physical Exam  Constitutional: She is oriented to person, place, and time. She appears well-developed and well-nourished.  HENT:  Head: Normocephalic and atraumatic.  Eyes: Right eye exhibits no discharge. Left eye exhibits no discharge.  Patient has conjunctival injection right eye, pupils reactive bilateral however significant photosensitivity in the right eye. Mild increased uptake 9 to 10:00 region. No leaking, no protrusion of the eye. No swelling around the eye or bleeding. Actually the muscle function intact without pain. Visual acuity equal both eyes.  Neck: Normal range of motion. Neck supple. No tracheal deviation present.  Cardiovascular: Normal rate.   Pulmonary/Chest: Effort normal.  Abdominal: Soft. She exhibits no distension. There is no tenderness. There is no guarding.  Musculoskeletal: She exhibits no edema.  Neurological: She is alert and oriented to person, place, and time.  Skin: Skin is warm. No rash noted.  Psychiatric: She has a normal mood and affect.  Nursing note  and vitals reviewed.   ED Course  Procedures (including critical care time) Ultrasound: Limited Ocular  Performed and interpreted by Dr Reather Converse Indication: eye pain righ Using high frequency linear probe, ultrasound of the globe was performed in real time in two planes with patient looking left and right.   Interpretation: no retinal detachment visualized.  Lens was in proper location.  No hemorrhage appreciated.  Images were electronically archived.     Labs Review Labs Reviewed - No data to display  Imaging Review No results found.   EKG Interpretation None      MDM   Final diagnoses:  Traumatic iritis  Corneal abrasion.  Patient presents with concern for corneal abrasion and traumatic iritis. Patient has intraocular pressure 15 with testing. Bedside ultrasound showed no signs of retinal detachment, lens in place. Patient symptoms improved with topical anesthetic.  Stressed close follow-up with ophthalmology on Monday morning. Plan for cycloplegic and antibiotic drops. Results and differential diagnosis were discussed with the patient/parent/guardian. Close follow up outpatient was discussed, comfortable with the plan.   Medications  fluorescein 1 MG ophthalmic strip (not administered)  tetracaine (PONTOCAINE) 0.5 % ophthalmic solution (not administered)    Filed Vitals:   04/12/14 0749  BP: 155/90  Pulse: 102  Temp: 98.6 F (37 C)  TempSrc: Oral  Resp: 18  Height: 5\' 2"  (1.575 m)  Weight: 150 lb (68.04 kg)  SpO2: 100%    Final diagnoses:  Traumatic iritis       Mariea Clonts, MD 04/12/14 [phone]

## 2014-04-12 NOTE — ED Notes (Signed)
Right eye light sensitive, blurry vision and drainage. Painful and reddened. Started Thursday, got better yesterday.

## 2015-06-20 ENCOUNTER — Emergency Department (HOSPITAL_BASED_OUTPATIENT_CLINIC_OR_DEPARTMENT_OTHER)
Admission: EM | Admit: 2015-06-20 | Discharge: 2015-06-20 | Disposition: A | Discharge status: Home / Self Care | Payer: Self-pay | Attending: Emergency Medicine | Admitting: Emergency Medicine

## 2015-06-20 ENCOUNTER — Encounter (HOSPITAL_BASED_OUTPATIENT_CLINIC_OR_DEPARTMENT_OTHER): Payer: Self-pay | Admitting: Emergency Medicine

## 2015-06-20 DIAGNOSIS — K0381 Cracked tooth: Secondary | ICD-10-CM | POA: Insufficient documentation

## 2015-06-20 DIAGNOSIS — K047 Periapical abscess without sinus: Secondary | ICD-10-CM | POA: Insufficient documentation

## 2015-06-20 DIAGNOSIS — I10 Essential (primary) hypertension: Secondary | ICD-10-CM | POA: Insufficient documentation

## 2015-06-20 DIAGNOSIS — Z79899 Other long term (current) drug therapy: Secondary | ICD-10-CM | POA: Insufficient documentation

## 2015-06-20 MED ORDER — PENICILLIN V POTASSIUM 500 MG PO TABS: 500.0000 mg | ORAL_TABLET | Freq: Four times a day (QID) | ORAL | Status: AC

## 2015-06-20 MED ORDER — KETOROLAC TROMETHAMINE 30 MG/ML IJ SOLN
30.0000 mg | Freq: Once | INTRAMUSCULAR | Status: AC
  Administered 2015-06-20: 30 mg via INTRAMUSCULAR
  Filled 2015-06-20: qty 1

## 2015-06-20 NOTE — ED Notes (Signed)
C/o left upper tooth pain x 2 days,  Has taken tylenol x 1 dose no relief

## 2015-06-20 NOTE — Discharge Instructions (Signed)

## 2015-06-20 NOTE — ED Provider Notes (Signed)
CSN: [id number]     Arrival date & time 06/20/15  0035 History   First MD Initiated Contact with Patient 06/20/15 0348     Chief Complaint  Patient presents with  . Dental Pain     (Consider location/radiation/quality/duration/timing/severity/associated sxs/prior Treatment) HPI Comments: 40 year old female who presents with left tooth pain. The patient states that she has had 2 days of worsening left upper molar tooth pain. She took Tylenol once with no relief. The pain is constant and severe. She denies any fevers, difficulty swallowing, or facial swelling. She has not yet scheduled an appointment with a dentist because she is waiting on her insurance card to come through.  The history is provided by the patient.    Past Medical History  Diagnosis Date  . Hypertension    Past Surgical History  Procedure Laterality Date  . Tubal ligation     History reviewed. No pertinent family history. Social History  Substance Use Topics  . Smoking status: Never Smoker   . Smokeless tobacco: None  . Alcohol Use: No   OB History    No data available     Review of Systems    Allergies  Review of patient's allergies indicates no known allergies.  Home Medications   Prior to Admission medications   Medication Sig Start Date End Date Taking? Authorizing Provider  cyclopentolate (CYCLODRYL,CYCLOGYL) 1 % ophthalmic solution Place 1 drop into the right eye 3 (three) times daily. 04/12/14   Elnora Morrison, MD  hydrochlorothiazide (HYDRODIURIL) 25 MG tablet Take 25 mg by mouth daily.    Historical Provider, MD  HYDROcodone-acetaminophen (HYCET) 7.5-325 mg/15 ml solution Take 29mL every four hours as needed for pain. 03/31/14   Shanon Rosser, MD  penicillin v potassium (VEETID) 500 MG tablet Take 1 tablet (500 mg total) by mouth 4 (four) times daily. 06/20/15 06/27/15  Wenda Overland Burlon Centrella, MD   BP 156/107 mmHg  Pulse 86  Temp(Src) 97.8 F (36.6 C) (Axillary)  Resp 18  Ht 5\' 2"  (1.575 m)  Wt  150 lb (68.04 kg)  BMI 27.43 kg/m2  SpO2 99%  LMP 06/01/2015 Physical Exam  Constitutional: She is oriented to person, place, and time. She appears well-developed and well-nourished. She appears distressed.  Holding side of face  HENT:  Head: Normocephalic and atraumatic.  Mouth/Throat:    Moist mucous membranes, no trismus, broken left upper molar without obvious mucosal swelling or drainage  Eyes: Conjunctivae are normal.  Lymphadenopathy:    She has no cervical adenopathy.  Neurological: She is alert and oriented to person, place, and time.  Skin: Skin is warm and dry. No rash noted.  Psychiatric: She has a normal mood and affect. Judgment normal.  Nursing note and vitals reviewed.   ED Course  Procedures (including critical care time) Labs Review Labs Reviewed - No data to display  Medications  ketorolac (TORADOL) 30 MG/ML injection 30 mg (30 mg Intramuscular Given 06/20/15 0413)     MDM   Final diagnoses:  Dental infection   Pt w/ 2 days of L upper tooth pain, h/o broken tooth. No evidence of fluid collection on exam, no facial swelling or trismus. Gave dose of Toradol and prescription for penicillin. Emphasized importance of follow-up with dentist as soon as possible. Reviewed return precautions including facial swelling, difficulty swallowing, fever, or worsening symptoms. Patient voiced understanding and was discharged in satisfactory condition.   Sharlett Iles, MD 06/20/15 [phone]

## 2015-06-20 NOTE — ED Notes (Signed)
Patient states that she had had a toothache x a few days

## 2015-06-21 NOTE — Care Management (Addendum)
CM received call from [facility] that pt had presented RX  ($40.00) received earlier in the ED and is requesting a less expensive medication.  Veetids 500 mg po 1 Tid #40 0 Refills was prescribed.  CM faxed GoodRx coupon for 17.00 to the [facility] pharmacist . Patient reported this is acceptable and is appreciative. No further CM needs.

## 2015-11-14 ENCOUNTER — Encounter (HOSPITAL_BASED_OUTPATIENT_CLINIC_OR_DEPARTMENT_OTHER): Payer: Self-pay | Admitting: *Deleted

## 2015-11-14 ENCOUNTER — Emergency Department (HOSPITAL_BASED_OUTPATIENT_CLINIC_OR_DEPARTMENT_OTHER)
Admission: EM | Admit: 2015-11-14 | Discharge: 2015-11-14 | Disposition: A | Discharge status: Home / Self Care | Payer: Self-pay | Attending: Emergency Medicine | Admitting: Emergency Medicine

## 2015-11-14 DIAGNOSIS — I10 Essential (primary) hypertension: Secondary | ICD-10-CM | POA: Insufficient documentation

## 2015-11-14 DIAGNOSIS — Z79899 Other long term (current) drug therapy: Secondary | ICD-10-CM | POA: Insufficient documentation

## 2015-11-14 DIAGNOSIS — K047 Periapical abscess without sinus: Secondary | ICD-10-CM | POA: Insufficient documentation

## 2015-11-14 DIAGNOSIS — K029 Dental caries, unspecified: Secondary | ICD-10-CM | POA: Insufficient documentation

## 2015-11-14 MED ORDER — AMOXICILLIN-POT CLAVULANATE 875-125 MG PO TABS: 1.0000 | ORAL_TABLET | Freq: Two times a day (BID) | ORAL | 0 refills | Status: DC

## 2015-11-14 MED ORDER — KETOROLAC TROMETHAMINE 60 MG/2ML IM SOLN
15.0000 mg | Freq: Once | INTRAMUSCULAR | Status: AC
  Administered 2015-11-14: 15 mg via INTRAMUSCULAR
  Filled 2015-11-14: qty 2

## 2015-11-14 MED ORDER — AMOXICILLIN-POT CLAVULANATE 875-125 MG PO TABS
1.0000 | ORAL_TABLET | Freq: Two times a day (BID) | ORAL | 0 refills | Status: DC
Start: 2015-11-14 — End: 2016-08-24

## 2015-11-14 MED ORDER — HYDROCHLOROTHIAZIDE 25 MG PO TABS: 25.0000 mg | ORAL_TABLET | Freq: Every day | ORAL | 3 refills | Status: DC

## 2015-11-14 NOTE — Discharge Instructions (Signed)
Take augmentin and tylenol for pain

## 2015-11-14 NOTE — ED Notes (Signed)
Pt reports she hasn't taken BP med today and needs a refill.

## 2015-11-14 NOTE — ED Triage Notes (Signed)
Pt reports mouth pain x33mo. States she has a broken tooth (L upper) that needs to be fixed. Reports she has an appt for 3wks from now but is in pain despite alternating Tylenol and Motrin for pain. Denies fever, drainage. Reports intermittent nausea; denies v/d.

## 2015-11-14 NOTE — ED Notes (Signed)
MD at bedside. 

## 2015-11-14 NOTE — ED Notes (Signed)
MD at bedside. 

## 2015-11-14 NOTE — ED Notes (Signed)
Pt refusing blood draw. States her son has a basketball game and she cannot stay.

## 2015-11-14 NOTE — ED Provider Notes (Signed)
[place] [facility]PT [facility] Provider Note   CSN: [id number] Arrival date & time: 11/14/15  1033     History   Chief Complaint Chief Complaint  Patient presents with  . Dental Pain    .  HPI Nicole Larson is a 40 y.o. Female with history of HTN and poor dentition with multiple ED visits for dental pain presents for left upper molar dental pain. Pain started to worsen approximately 1 week ago and has been treated with over-the-counter Motrin and Tylenol. She's been seen for this same issue in April of this year but has not been to a dentist due to insurance issues. She does report having a dentist appointment in 3 weeks. She denies fever, throat pain, mouth swelling, face swelling, facial pain. She reports she is able to eat normally she she was on the opposite side of her mouth.  She additionally reported that she was out of her hydrochlorothiazide had taken it this morning. Her blood pressure is mildly elevated. She denied headache, changes in vision, shortness of breath, chest pain. Otherwise she denies recent illnesses, in nausea, vomiting, diarrhea, abdominal pain       Past Medical History:  Diagnosis Date  . Hypertension     There are no active problems to display for this patient.   Past Surgical History:  Procedure Laterality Date  . TUBAL LIGATION      OB History    No data available       Home Medications    Prior to Admission medications   Medication Sig Start Date End Date Taking? Authorizing Provider  hydrochlorothiazide (HYDRODIURIL) 25 MG tablet Take 25 mg by mouth daily.   Yes Historical Provider, MD  Multiple Vitamin (MULTIVITAMIN) tablet Take 1 tablet by mouth daily.   Yes Historical Provider, MD  vitamin C (ASCORBIC ACID) 500 MG tablet Take 500 mg by mouth daily.   Yes Historical Provider, MD  amoxicillin-clavulanate (AUGMENTIN) 875-125 MG tablet Take 1 tablet by mouth 2 (two) times daily. 11/14/15   Veatrice Bourbon, MD  cyclopentolate  (CYCLODRYL,CYCLOGYL) 1 % ophthalmic solution Place 1 drop into the right eye 3 (three) times daily. 04/12/14   Elnora Morrison, MD  hydrochlorothiazide (HYDRODIURIL) 25 MG tablet Take 1 tablet (25 mg total) by mouth daily. 05/07/11 11/13/16  Cassell Smiles, MD  hydrochlorothiazide (HYDRODIURIL) 25 MG tablet Take 1 tablet (25 mg total) by mouth daily. 11/14/15   Veatrice Bourbon, MD  HYDROcodone-acetaminophen (HYCET) 7.5-325 mg/15 ml solution Take 73mL every four hours as needed for pain. 03/31/14   Shanon Rosser, MD    Family History No family history on file.  Social History Social History  Substance Use Topics  . Smoking status: Never Smoker  . Smokeless tobacco: Never Used  . Alcohol use Yes     Comment: occasionally throughout year     Allergies   Review of patient's allergies indicates no known allergies.   Review of Systems Review of Systems  Constitutional: Negative.   HENT: Positive for dental problem.   Eyes: Negative.   Respiratory: Negative.   Cardiovascular: Negative.   Gastrointestinal: Negative.   Genitourinary: Negative.   Musculoskeletal: Negative.   Neurological: Negative.      Physical Exam Updated Vital Signs BP (!) 176/116 (BP Location: Left Arm)   Pulse 100   Temp 98.1 F (36.7 C) (Oral)   Resp 16   Ht 5\' 2"  (1.575 m)   Wt 70.3 kg   LMP 11/05/2015 (Approximate)   SpO2 100%  BMI 28.35 kg/m   Physical Exam  Constitutional: She is oriented to person, place, and time. She appears well-developed and well-nourished.  HENT:  Head: Normocephalic and atraumatic.  Mouth/Throat: Dental caries present.    Eyes: EOM are normal. Pupils are equal, round, and reactive to light.  Neck: Normal range of motion.  Cardiovascular: Normal rate, regular rhythm and normal heart sounds.   No murmur heard. Pulmonary/Chest: Effort normal and breath sounds normal. No respiratory distress. She has no wheezes.  Abdominal: Soft. Bowel sounds are normal.  Musculoskeletal:  Normal range of motion.  Neurological: She is alert and oriented to person, place, and time.  Skin: Skin is warm and dry.  Psychiatric: She has a normal mood and affect.     ED Treatments / Results  Labs (all labs ordered are listed, but only abnormal results are displayed) Labs Reviewed - No data to display  EKG  EKG Interpretation None       Radiology No results found.  Procedures Procedures (including critical care time)  Medications Ordered in ED Medications  ketorolac (TORADOL) injection 15 mg (15 mg Intramuscular Given 11/14/15 1208)     Initial Impression / Assessment and Plan / ED Course  I have reviewed the triage vital signs and the nursing notes.  Pertinent labs & imaging results that were available during my care of the patient were reviewed by me and considered in my medical decision making (see chart for details).  Clinical Course    Uncomplicated ED course  Final Clinical Impressions(s) / ED Diagnoses   Final diagnoses:  Tooth infection   40 year old female presenting for tooth pain. She is hemodynamically stable, with no evidence of abscess, Ludwig angina. Patient was given Toradol IM 15 mg, and started on a course of Augmentin. She was counseled to follow with her dentist as scheduled. Additionally she was refilled for HCTZ and advised to follow with PCP as soon as possible. ED return precautions the patient was discharged to home in stable condition. All questions were answered to the patient's satisfaction and she expressed her understanding and acceptance for the plan.   New Prescriptions Discharge Medication List as of 11/14/2015 12:31 PM    START taking these medications   Details  !! hydrochlorothiazide (HYDRODIURIL) 25 MG tablet Take 1 tablet (25 mg total) by mouth daily., Starting Sat 05/07/2011, Until Sun 11/13/2016, Print    !! hydrochlorothiazide (HYDRODIURIL) 25 MG tablet Take 1 tablet (25 mg total) by mouth daily., Starting Sat  11/14/2015, Print     !! - Potential duplicate medications found. Please discuss with provider.      Bralyn Folkert A. Lincoln Brigham MD, [facility] Resident PGY-3 Pager [phone]    Veatrice Bourbon, MD 11/14/15 [id number]    Merrily Pew, MD 11/14/15 [phone]

## 2015-11-15 ENCOUNTER — Encounter (HOSPITAL_BASED_OUTPATIENT_CLINIC_OR_DEPARTMENT_OTHER): Payer: Self-pay | Admitting: *Deleted

## 2015-11-15 ENCOUNTER — Emergency Department (HOSPITAL_BASED_OUTPATIENT_CLINIC_OR_DEPARTMENT_OTHER)
Admission: EM | Admit: 2015-11-15 | Discharge: 2015-11-15 | Disposition: A | Discharge status: Home / Self Care | Payer: Self-pay | Attending: Emergency Medicine | Admitting: Emergency Medicine

## 2015-11-15 DIAGNOSIS — K0889 Other specified disorders of teeth and supporting structures: Secondary | ICD-10-CM | POA: Insufficient documentation

## 2015-11-15 DIAGNOSIS — I10 Essential (primary) hypertension: Secondary | ICD-10-CM | POA: Insufficient documentation

## 2015-11-15 DIAGNOSIS — Z79899 Other long term (current) drug therapy: Secondary | ICD-10-CM | POA: Insufficient documentation

## 2015-11-15 MED ORDER — HYDROCODONE-ACETAMINOPHEN 5-325 MG PO TABS: 1.0000 | ORAL_TABLET | Freq: Four times a day (QID) | ORAL | 0 refills | Status: DC | PRN

## 2015-11-15 MED ORDER — IBUPROFEN 400 MG PO TABS
400.0000 mg | ORAL_TABLET | Freq: Once | ORAL | Status: AC
  Administered 2015-11-15: 400 mg via ORAL
  Filled 2015-11-15: qty 1

## 2015-11-15 MED ORDER — BUPIVACAINE-EPINEPHRINE (PF) 0.5% -1:200000 IJ SOLN
1.8000 mL | Freq: Once | INTRAMUSCULAR | Status: AC
  Administered 2015-11-15: 1.8 mL
  Filled 2015-11-15: qty 1.8

## 2015-11-15 NOTE — ED Triage Notes (Signed)
Pt states that she was seen earlier for left upper dental pain. States she was prescribed an antibiotic and she did get that filled and has taken a dose. Also states she was prescribed a blood pressure medication and she has taken a dose of that as well. Took a goody powder at [employer]. States the pain woke her up this morning. States pain is shooting and throbbing. States she has an appt in 3 weeks at her dentist

## 2015-11-15 NOTE — ED Provider Notes (Signed)
[place] [facility]PT [facility] Provider Note: Nicole Spurling, MD, FACEP  CSN: [id number] MRN: [id number] ARRIVAL: 11/15/15 at [name] is a 40 y.o. female with a history of hypertension. She was seen yesterday for a painful left upper molar. She rated her pain then as a 4 out of 10. She was given a shot of Toradol. She was noted to be hypertensive and her hydrochlorothiazide was refilled. She is discharged home on amoxicillin. She is here with pain that acutely worsened about 1 AM today. She rates it as severe now. Pain is worse when lying on her left side. There is some swelling of the adjacent gum.  She states she has an appointment with a dentist in 3 weeks.  Past Medical History:  Diagnosis Date  . Hypertension     Past Surgical History:  Procedure Laterality Date  . TUBAL LIGATION      No family history on file.  Social History  Substance Use Topics  . Smoking status: Never Smoker  . Smokeless tobacco: Never Used  . Alcohol use Yes     Comment: occasionally throughout year    Prior to Admission medications   Medication Sig Start Date End Date Taking? Authorizing Provider  amoxicillin-clavulanate (AUGMENTIN) 875-125 MG tablet Take 1 tablet by mouth 2 (two) times daily. 11/14/15   Nicole Bourbon, MD  cyclopentolate (CYCLODRYL,CYCLOGYL) 1 % ophthalmic solution Place 1 drop into the right eye 3 (three) times daily. 04/12/14   Nicole Morrison, MD  hydrochlorothiazide (HYDRODIURIL) 25 MG tablet Take 1 tablet (25 mg total) by mouth daily. 05/07/11 11/13/16  Nicole Smiles, MD  hydrochlorothiazide (HYDRODIURIL) 25 MG tablet Take 25 mg by mouth daily.    Historical Provider, MD  hydrochlorothiazide (HYDRODIURIL) 25 MG tablet Take 1 tablet (25 mg total) by mouth daily. 11/14/15   Nicole Bourbon, MD  HYDROcodone-acetaminophen (HYCET) 7.5-325 mg/15 ml solution Take 61mL every four hours as needed for pain. 03/31/14   Nicole Rosser, MD  Multiple Vitamin (MULTIVITAMIN) tablet Take 1 tablet by mouth daily.    Historical Provider, MD  vitamin C (ASCORBIC ACID) 500 MG tablet Take 500 mg by mouth daily.    Historical Provider, MD    Allergies Review of patient's allergies indicates no known allergies.   REVIEW OF SYSTEMS  Negative except as noted here or in the History of Present Illness.   PHYSICAL EXAMINATION  Initial Vital Signs Blood pressure (!) 183/124, pulse 109, temperature 99.2 F (37.3 C), temperature source Oral, resp. rate 18, height 5\' 2"  (1.575 m), weight 155 lb (70.3 kg), last menstrual period 11/05/2015, SpO2 97 %.  Examination General: Well-developed, well-nourished female in no acute distress; appearance consistent with age of record HENT: normocephalic; atraumatic; poor dentition with multiple missing teeth; left upper molar with caries, tenderness to percussion and swelling of the adjacent gum Eyes: pupils equal, round and reactive to light; extraocular muscles intact Neck: supple; no lymphadenopathy Heart: regular rate and rhythm Lungs: clear to auscultation bilaterally Abdomen: soft; nondistended Extremities: No deformity; full range of motion Neurologic: Awake, alert and oriented; motor function intact in all extremities and symmetric; no facial droop Skin: Warm and dry Psychiatric: Flat affect   RESULTS  Summary of this visit's results, reviewed by myself:   EKG Interpretation  Date/Time:    Ventricular Rate:    PR Interval:    QRS Duration:   QT Interval:  QTC Calculation:   R Axis:     Text Interpretation:        Laboratory Studies: No results found for this or any previous visit (from the past 24 hour(s)). Imaging Studies: No results found.  ED COURSE  Nursing notes and initial vitals signs, including pulse oximetry, reviewed.  Vitals:   11/15/15 0156  BP: (!) 183/124  Pulse: 109  Resp: 18  Temp: 99.2 F (37.3 C)  TempSrc: Oral  SpO2: 97%  Weight:  155 lb (70.3 kg)  Height: 5\' 2"  (1.575 m)    PROCEDURES   DENTAL BLOCK 1.8 milliliters of 0.5% bupivacaine with epinephrine were injected into the buccal fold adjacent to the left upper affected molar. The patient tolerated this well and there were no immediate complications. Adequate analgesia was obtained.  ED DIAGNOSES     ICD-9-CM ICD-10-CM   1. Pain, dental 525.9 K08.89        Nicole Rosser, MD 11/15/15 0401

## 2016-02-11 ENCOUNTER — Encounter (HOSPITAL_BASED_OUTPATIENT_CLINIC_OR_DEPARTMENT_OTHER): Payer: Self-pay

## 2016-02-11 ENCOUNTER — Emergency Department (HOSPITAL_BASED_OUTPATIENT_CLINIC_OR_DEPARTMENT_OTHER)
Admission: EM | Admit: 2016-02-11 | Discharge: 2016-02-11 | Disposition: A | Discharge status: Home / Self Care | Payer: BLUE CROSS/BLUE SHIELD | Attending: Emergency Medicine | Admitting: Emergency Medicine

## 2016-02-11 DIAGNOSIS — J029 Acute pharyngitis, unspecified: Secondary | ICD-10-CM | POA: Insufficient documentation

## 2016-02-11 DIAGNOSIS — J040 Acute laryngitis: Secondary | ICD-10-CM | POA: Diagnosis not present

## 2016-02-11 DIAGNOSIS — Z79899 Other long term (current) drug therapy: Secondary | ICD-10-CM | POA: Insufficient documentation

## 2016-02-11 DIAGNOSIS — I10 Essential (primary) hypertension: Secondary | ICD-10-CM | POA: Insufficient documentation

## 2016-02-11 LAB — RAPID STREP SCREEN (MED CTR MEBANE ONLY): STREPTOCOCCUS, GROUP A SCREEN (DIRECT): NEGATIVE

## 2016-02-11 MED ORDER — DEXAMETHASONE 6 MG PO TABS
10.0000 mg | ORAL_TABLET | Freq: Once | ORAL | Status: AC
  Administered 2016-02-11: 10 mg via ORAL
  Filled 2016-02-11: qty 1

## 2016-02-11 NOTE — Discharge Instructions (Signed)
Take 4 over the counter ibuprofen tablets 3 times a day or 2 over-the-counter naproxen tablets twice a day for pain. Also take tylenol 1000mg(2 extra strength) four times a day.    

## 2016-02-11 NOTE — ED Provider Notes (Signed)
[place] [facility] Provider Note   CSN: [id number] Arrival date & time: 02/11/16  2136   By signing my name below, I, Delton Prairie, attest that this documentation has been prepared under the direction and in the presence of Deno Etienne, DO  Electronically Signed: Delton Prairie, ED Scribe. 02/11/16. 9:57 PM.   History   Chief Complaint Chief Complaint  Patient presents with  . Sore Throat    The history is provided by the patient. No language interpreter was used.   HPI Comments:  Nicole Larson is a 40 y.o. female who presents to the Emergency Department complaining of sudden onset, persistent cough x 3 days. Pt notes sick contacts at work, associated postnasal drip, sore throat and voice change. No alleviating factors noted. Pt denies pain or blisters to her BUE/BLE, abdominal pain, fevers, any other associated symptoms and modifying factors at this time.   Past Medical History:  Diagnosis Date  . Hypertension     There are no active problems to display for this patient.   Past Surgical History:  Procedure Laterality Date  . TUBAL LIGATION      OB History    No data available       Home Medications    Prior to Admission medications   Medication Sig Start Date End Date Taking? Authorizing Provider  amoxicillin-clavulanate (AUGMENTIN) 875-125 MG tablet Take 1 tablet by mouth 2 (two) times daily. 11/14/15   Veatrice Bourbon, MD  hydrochlorothiazide (HYDRODIURIL) 25 MG tablet Take 1 tablet (25 mg total) by mouth daily. 05/07/11 11/13/16  Cassell Smiles, MD  hydrochlorothiazide (HYDRODIURIL) 25 MG tablet Take 25 mg by mouth daily.    Historical Provider, MD  hydrochlorothiazide (HYDRODIURIL) 25 MG tablet Take 1 tablet (25 mg total) by mouth daily. 11/14/15   Veatrice Bourbon, MD  HYDROcodone-acetaminophen (NORCO/VICODIN) 5-325 MG tablet Take 1-2 tablets by mouth every 6 (six) hours as needed (for pain). 11/15/15   John Molpus, MD  Multiple Vitamin (MULTIVITAMIN) tablet Take  1 tablet by mouth daily.    Historical Provider, MD  vitamin C (ASCORBIC ACID) 500 MG tablet Take 500 mg by mouth daily.    Historical Provider, MD    Family History No family history on file.  Social History Social History  Substance Use Topics  . Smoking status: Never Smoker  . Smokeless tobacco: Never Used  . Alcohol use Yes     Comment: occasionally throughout year     Allergies   Patient has no known allergies.   Review of Systems Review of Systems  Constitutional: Negative for chills and fever.  HENT: Positive for postnasal drip, sore throat and voice change. Negative for congestion and rhinorrhea.   Eyes: Negative for redness and visual disturbance.  Respiratory: Positive for cough. Negative for shortness of breath and wheezing.   Cardiovascular: Negative for chest pain and palpitations.  Gastrointestinal: Negative for nausea and vomiting.  Genitourinary: Negative for dysuria and urgency.  Musculoskeletal: Negative for arthralgias and myalgias.  Skin: Negative for pallor and wound.  Neurological: Negative for dizziness and headaches.   Physical Exam Updated Vital Signs BP (!) 179/113 (BP Location: Left Arm)   Pulse 99   Temp 98.2 F (36.8 C) (Oral)   Resp 18   Ht 5\' 2"  (1.575 m)   Wt 169 lb (76.7 kg)   LMP 01/03/2016   SpO2 100%   BMI 30.91 kg/m   Physical Exam  Constitutional: She is oriented to person, place, and time. She appears  well-developed and well-nourished. No distress.  HENT:  Head: Normocephalic and atraumatic.  Right Ear: Tympanic membrane normal.  Left Ear: Tympanic membrane normal.  Mouth/Throat: Uvula is midline. Tonsils are 3+ on the right. Tonsils are 3+ on the left.  Purulence to posterior poles. Posterior nasal drip. No unilateral swelling   Eyes: EOM are normal. Pupils are equal, round, and reactive to light.  Neck: Normal range of motion. Neck supple.  Cardiovascular: Normal rate and regular rhythm.  Exam reveals no gallop and no  friction rub.   No murmur heard. Pulmonary/Chest: Effort normal and breath sounds normal. She has no wheezes. She has no rales.  Lungs clear.  Abdominal: Soft. She exhibits no distension. There is no tenderness.  Musculoskeletal: She exhibits no edema or tenderness.  Neurological: She is alert and oriented to person, place, and time.  Skin: Skin is warm and dry. She is not diaphoretic.  Psychiatric: She has a normal mood and affect. Her behavior is normal.  Nursing note and vitals reviewed.  ED Treatments / Results  DIAGNOSTIC STUDIES:  Oxygen Saturation is 100% on RA, normal by my interpretation.    COORDINATION OF CARE:  9:56 PM Discussed treatment plan with pt at bedside and pt agreed to plan.  Labs (all labs ordered are listed, but only abnormal results are displayed) Labs Reviewed  RAPID STREP SCREEN (NOT AT [facility])    EKG  EKG Interpretation None       Radiology No results found.  Procedures Procedures (including critical care time)  Medications Ordered in ED Medications - No data to display   Initial Impression / Assessment and Plan / ED Course  I have reviewed the triage vital signs and the nursing notes.  Pertinent labs & imaging results that were available during my care of the patient were reviewed by me and considered in my medical decision making (see chart for details).  Clinical Course     40 yo F With a chief complaint of a sore throat. Going on for the past couple days. Now has lost her voice. Tolerating secretions without difficulty no pain with turning her head back and forth. On my exam patient has swollen tonsils with some purulence. No noted sublingual swelling. Rapid strep was negative. I suspect this is a viral pharyngitis. We'll give her a dose of Decadron have the patient take Tylenol and ibuprofen with the fluids. PCP follow-up.  10:20 PM:  I have discussed the diagnosis/risks/treatment options with the patient and family and believe the  pt to be eligible for discharge home to follow-up with PCP. We also discussed returning to the ED immediately if new or worsening sx occur. We discussed the sx which are most concerning (e.g., sudden worsening pain, fever, inability to tolerate by mouth) that necessitate immediate return. Medications administered to the patient during their visit and any new prescriptions provided to the patient are listed below.  Medications given during this visit Medications  dexamethasone (DECADRON) tablet 10 mg (10 mg Oral Given 02/11/16 2208)     The patient appears reasonably screen and/or stabilized for discharge and I doubt any other medical condition or other [facility] requiring further screening, evaluation, or treatment in the ED at this time prior to discharge.    Final Clinical Impressions(s) / ED Diagnoses   Final diagnoses:  None    New Prescriptions New Prescriptions   No medications on file   I personally performed the services described in this documentation, which was scribed in my presence.  The recorded information has been reviewed and is accurate.     Deno Etienne, DO 02/11/16 2221

## 2016-02-11 NOTE — ED Triage Notes (Signed)
Pt c/o sore throat, lost voice and cough that started today

## 2016-02-11 NOTE — ED Notes (Signed)
Pt seen by EDP prior to RN assessment, see MD notes, orders received to medicate and d/c.

## 2016-02-14 ENCOUNTER — Encounter (HOSPITAL_BASED_OUTPATIENT_CLINIC_OR_DEPARTMENT_OTHER): Payer: Self-pay | Admitting: Emergency Medicine

## 2016-02-14 ENCOUNTER — Emergency Department (HOSPITAL_BASED_OUTPATIENT_CLINIC_OR_DEPARTMENT_OTHER)
Admission: EM | Admit: 2016-02-14 | Discharge: 2016-02-14 | Disposition: A | Discharge status: Home / Self Care | Payer: BLUE CROSS/BLUE SHIELD | Attending: Emergency Medicine | Admitting: Emergency Medicine

## 2016-02-14 ENCOUNTER — Emergency Department (HOSPITAL_BASED_OUTPATIENT_CLINIC_OR_DEPARTMENT_OTHER): Payer: BLUE CROSS/BLUE SHIELD

## 2016-02-14 DIAGNOSIS — I10 Essential (primary) hypertension: Secondary | ICD-10-CM

## 2016-02-14 DIAGNOSIS — R000 Tachycardia, unspecified: Secondary | ICD-10-CM | POA: Diagnosis not present

## 2016-02-14 DIAGNOSIS — R0789 Other chest pain: Secondary | ICD-10-CM | POA: Diagnosis present

## 2016-02-14 DIAGNOSIS — Z79899 Other long term (current) drug therapy: Secondary | ICD-10-CM | POA: Diagnosis not present

## 2016-02-14 LAB — COMPREHENSIVE METABOLIC PANEL
ALK PHOS: 42 U/L (ref 38–126)
ALT: 21 U/L (ref 14–54)
AST: 26 U/L (ref 15–41)
Albumin: 4.3 g/dL (ref 3.5–5.0)
Anion gap: 9 (ref 5–15)
BUN: 12 mg/dL (ref 6–20)
CALCIUM: 9.4 mg/dL (ref 8.9–10.3)
CO2: 27 mmol/L (ref 22–32)
CREATININE: 1.05 mg/dL — AB (ref 0.44–1.00)
Chloride: 102 mmol/L (ref 101–111)
Glucose, Bld: 113 mg/dL — ABNORMAL HIGH (ref 65–99)
Potassium: 3.2 mmol/L — ABNORMAL LOW (ref 3.5–5.1)
Sodium: 138 mmol/L (ref 135–145)
Total Bilirubin: 0.3 mg/dL (ref 0.3–1.2)
Total Protein: 8.2 g/dL — ABNORMAL HIGH (ref 6.5–8.1)

## 2016-02-14 LAB — CULTURE, GROUP A STREP (THRC)

## 2016-02-14 LAB — CBC
HCT: 36.5 % (ref 36.0–46.0)
Hemoglobin: 13.1 g/dL (ref 12.0–15.0)
MCH: 28.1 pg (ref 26.0–34.0)
MCHC: 35.9 g/dL (ref 30.0–36.0)
MCV: 78.3 fL (ref 78.0–100.0)
PLATELETS: 266 10*3/uL (ref 150–400)
RBC: 4.66 MIL/uL (ref 3.87–5.11)
RDW: 13.6 % (ref 11.5–15.5)
WBC: 7.8 10*3/uL (ref 4.0–10.5)

## 2016-02-14 LAB — D-DIMER, QUANTITATIVE (NOT AT ARMC): D DIMER QUANT: 0.41 ug{FEU}/mL (ref 0.00–0.50)

## 2016-02-14 LAB — TROPONIN I: Troponin I: <0.03 ng/mL (ref <0.03)

## 2016-02-14 MED ORDER — KETOROLAC TROMETHAMINE 30 MG/ML IJ SOLN
30.0000 mg | Freq: Once | INTRAMUSCULAR | Status: AC
  Administered 2016-02-14: 30 mg via INTRAVENOUS
  Filled 2016-02-14: qty 1

## 2016-02-14 NOTE — ED Triage Notes (Signed)
Was referred to ED for dizziness x 2 hrs now resolved and had pain to left shoulder and neck which has resolved and now has right sided chest pain worse with inspiration. Seen in ED 3 days ago for virus

## 2016-02-14 NOTE — ED Provider Notes (Signed)
[facility] DEPT [facility] Provider Note   CSN: [id number] Arrival date & time: 02/14/16  1912   By signing my name below, I, Nicole Larson and [name], attest that this documentation has been prepared under the direction and in the presence of Isla Pence, MD.  Electronically Signed: Julien Nordmann, ED Scribe. 02/14/16. 7:48 PM.   History   Chief Complaint Chief Complaint  Patient presents with  . Chest Pain    HPI HPI Comments: Nicole Larson is a 40 y.o. female with h/o HTN who presents to the Emergency Department complaining of constant, moderate, right sided chest pain onset one hour ago.  Pt reports that she was sitting down when her chest pain started. Upon onset the pain was described as "feeling hot, pain in her neck, and tensing up". Pt states that she took her BP medication and checked her BP which was 148/102 and then called the nursing hotline. She was instructed to take her BP medication again and check her BP which was 178/120. Pt states that she takes her BP medications regularly but has missed one or two doses. Pt reports that she took Theraflu once about two days ago. LNMP was last month. Pt has not taken any decongestants. Pt denies abdominal pain, leg pain or calves pain. Pt is a non-smoker and does not drink EtOH.    The history is provided by the patient. No language interpreter was used.    Past Medical History:  Diagnosis Date  . Hypertension     There are no active problems to display for this patient.   Past Surgical History:  Procedure Laterality Date  . TUBAL LIGATION      OB History    No data available       Home Medications    Prior to Admission medications   Medication Sig Start Date End Date Taking? Authorizing Provider  hydrochlorothiazide (HYDRODIURIL) 25 MG tablet Take 1 tablet (25 mg total) by mouth daily. 05/07/11 11/13/16 Yes Cassell Smiles, MD  Multiple Vitamin (MULTIVITAMIN) tablet Take 1 tablet by mouth daily.   Yes  Historical Provider, MD  vitamin C (ASCORBIC ACID) 500 MG tablet Take 500 mg by mouth daily.   Yes Historical Provider, MD  amoxicillin-clavulanate (AUGMENTIN) 875-125 MG tablet Take 1 tablet by mouth 2 (two) times daily. 11/14/15   Veatrice Bourbon, MD  hydrochlorothiazide (HYDRODIURIL) 25 MG tablet Take 25 mg by mouth daily.    Historical Provider, MD  hydrochlorothiazide (HYDRODIURIL) 25 MG tablet Take 1 tablet (25 mg total) by mouth daily. 11/14/15   Veatrice Bourbon, MD  HYDROcodone-acetaminophen (NORCO/VICODIN) 5-325 MG tablet Take 1-2 tablets by mouth every 6 (six) hours as needed (for pain). 11/15/15   Shanon Rosser, MD    Family History No family history on file.  Social History Social History  Substance Use Topics  . Smoking status: Never Smoker  . Smokeless tobacco: Never Used  . Alcohol use Yes     Comment: occasionally throughout year     Allergies   Patient has no known allergies.   Review of Systems Review of Systems  Cardiovascular: Positive for chest pain.   A complete 10 system review of systems was obtained and all systems are negative except as noted in the HPI and PMH.    Physical Exam Updated Vital Signs BP (!) 162/102 (BP Location: Right Arm)   Pulse 110   Temp 98.5 F (36.9 C) (Oral)   Resp 24   Ht 5\' 2"  (1.575 m)  Wt 169 lb (76.7 kg)   LMP 01/12/2016   SpO2 100%   BMI 30.91 kg/m   Physical Exam  Constitutional: She is oriented to person, place, and time. She appears well-developed and well-nourished.  HENT:  Head: Normocephalic and atraumatic.  Eyes: EOM are normal.  Neck: Normal range of motion.  Cardiovascular: Regular rhythm.  Tachycardia present.   Pulmonary/Chest: Effort normal and breath sounds normal.  Abdominal: Soft. She exhibits no distension. There is no tenderness.  Musculoskeletal: Normal range of motion.  Neurological: She is alert and oriented to person, place, and time.  Skin: Skin is warm and dry.  Psychiatric: She has a  normal mood and affect. Judgment normal.  Nursing note and vitals reviewed.    ED Treatments / Results   DIAGNOSTIC STUDIES: Oxygen Saturation is 100% on RA, normal by my interpretation.  COORDINATION OF CARE: 7:22 PM Discussed treatment plan with pt at bedside and pt agreed to plan.   Labs (all labs ordered are listed, but only abnormal results are displayed) Labs Reviewed  COMPREHENSIVE METABOLIC PANEL - Abnormal; Notable for the following:       Result Value   Potassium 3.2 (*)    Glucose, Bld 113 (*)    Creatinine, Ser 1.05 (*)    Total Protein 8.2 (*)    All other components within normal limits  CBC  TROPONIN I  D-DIMER, QUANTITATIVE (NOT AT [facility])  URINALYSIS, ROUTINE W REFLEX MICROSCOPIC    EKG  EKG Interpretation  Date/Time:  Sunday February 14 2016 19:20:51 EST Ventricular Rate:  110 PR Interval:    QRS Duration: 80 QT Interval:  327 QTC Calculation: 443 R Axis:   58 Text Interpretation:  Sinus tachycardia Consider left ventricular hypertrophy Baseline wander in lead(s) II III aVF V2 Confirmed by Esaiah Wanless MD, Bettyjane Shenoy ([id number]) on 02/14/2016 7:23:35 PM       Radiology Dg Chest 2 View  Result Date: 02/14/2016 CLINICAL DATA:  Right-sided chest pain for 1 day, initial encounter EXAM: CHEST  2 VIEW COMPARISON:  None. FINDINGS: The heart size and mediastinal contours are within normal limits. Both lungs are clear. The visualized skeletal structures are unremarkable. IMPRESSION: No active cardiopulmonary disease. Electronically Signed   By: Inez Catalina M.D.   On: 02/14/2016 19:55    Procedures Procedures (including critical care time)  Medications Ordered in ED Medications  ketorolac (TORADOL) 30 MG/ML injection 30 mg (30 mg Intravenous Given 02/14/16 1938)     Initial Impression / Assessment and Plan / ED Course  I have reviewed the triage vital signs and the nursing notes.  Pertinent labs & imaging results that were available during my care of the  patient were reviewed by me and considered in my medical decision making (see chart for details).  Clinical Course     CP better with toradol.  Pt's bp is down to close to normal.  Pt ok for d/c.  She knows to return if worse.  Final Clinical Impressions(s) / ED Diagnoses   Final diagnoses:  Chest wall pain  Essential hypertension   I personally performed the services described in this documentation, which was scribed in my presence. The recorded information has been reviewed and is accurate.   New Prescriptions New Prescriptions   No medications on file     Isla Pence, MD 02/14/16 2044

## 2016-07-11 ENCOUNTER — Emergency Department (HOSPITAL_BASED_OUTPATIENT_CLINIC_OR_DEPARTMENT_OTHER)
Admission: EM | Admit: 2016-07-11 | Discharge: 2016-07-11 | Disposition: A | Discharge status: Home / Self Care | Payer: BLUE CROSS/BLUE SHIELD | Attending: Emergency Medicine | Admitting: Emergency Medicine

## 2016-07-11 ENCOUNTER — Encounter (HOSPITAL_BASED_OUTPATIENT_CLINIC_OR_DEPARTMENT_OTHER): Payer: Self-pay | Admitting: Emergency Medicine

## 2016-07-11 DIAGNOSIS — I10 Essential (primary) hypertension: Secondary | ICD-10-CM | POA: Insufficient documentation

## 2016-07-11 DIAGNOSIS — R109 Unspecified abdominal pain: Secondary | ICD-10-CM

## 2016-07-11 LAB — URINALYSIS, ROUTINE W REFLEX MICROSCOPIC
Bilirubin Urine: NEGATIVE
Glucose, UA: NEGATIVE mg/dL
Hgb urine dipstick: NEGATIVE
KETONES UR: NEGATIVE mg/dL
LEUKOCYTES UA: NEGATIVE
NITRITE: NEGATIVE
PROTEIN: NEGATIVE mg/dL
Specific Gravity, Urine: 1.011 (ref 1.005–1.030)
pH: 7.5 (ref 5.0–8.0)

## 2016-07-11 LAB — PREGNANCY, URINE: Preg Test, Ur: NEGATIVE

## 2016-07-11 MED ORDER — CYCLOBENZAPRINE HCL 10 MG PO TABS: 10.0000 mg | ORAL_TABLET | Freq: Three times a day (TID) | ORAL | 0 refills | Status: DC | PRN

## 2016-07-11 MED ORDER — NAPROXEN 500 MG PO TABS: 500.0000 mg | ORAL_TABLET | Freq: Two times a day (BID) | ORAL | 0 refills | Status: DC

## 2016-07-11 MED ORDER — HYDROCHLOROTHIAZIDE 25 MG PO TABS: 25.0000 mg | ORAL_TABLET | Freq: Every day | ORAL | 1 refills | Status: DC

## 2016-07-11 MED FILL — HYDROCHLOROTHIAZIDE 25 MG T: 25 | 30 days supply | Qty: 30 | Fill #0 | Status: TO

## 2016-07-11 MED FILL — CYCLOBENZAPRINE 10 MG TAB: 10 | 4 days supply | Qty: 12 | Fill #0

## 2016-07-11 MED FILL — NAPROXEN 500 MG TABLET: 500 | 10 days supply | Qty: 20 | Fill #0

## 2016-07-11 NOTE — ED Provider Notes (Signed)
[place] DEPT [facility] Provider Note   CSN: [id number] Arrival date & time: 07/11/16  2947     History   Chief Complaint Chief Complaint  Patient presents with  . Back Pain    HPI Nicole Larson is a 41 y.o. female.  Patient is a 41 year old female with no significant past medical history. She presents for evaluation of right flank pain that started last week. 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 She denies any radiation into her legs. She denies any bowel or bladder complaints. The pain comes and goes throughout the day on multiple occasions.    Flank Pain  This is a new problem. Episode onset: 1 week ago. Episode frequency: Intermittently. The problem has not changed since onset.Nothing aggravates the symptoms. Nothing relieves the symptoms. She has tried nothing for the symptoms.    Past Medical History:  Diagnosis Date  . Hypertension     There are no active problems to display for this patient.   Past Surgical History:  Procedure Laterality Date  . TUBAL LIGATION      OB History    No data available       Home Medications    Prior to Admission medications   Medication Sig Start Date End Date Taking? Authorizing Provider  amoxicillin-clavulanate (AUGMENTIN) 875-125 MG tablet Take 1 tablet by mouth 2 (two) times daily. 11/14/15   Haney, Yetta Flock A, MD  hydrochlorothiazide (HYDRODIURIL) 25 MG tablet Take 1 tablet (25 mg total) by mouth daily. 05/07/11 11/13/16  Cassell Smiles, MD  hydrochlorothiazide (HYDRODIURIL) 25 MG tablet Take 25 mg by mouth daily.    [provider]  hydrochlorothiazide (HYDRODIURIL) 25 MG tablet Take 1 tablet (25 mg total) by mouth daily. 11/14/15   Veatrice Bourbon, MD  HYDROcodone-acetaminophen (NORCO/VICODIN) 5-325 MG tablet Take 1-2 tablets by mouth every 6 (six) hours as needed (for pain). 11/15/15   Molpus, John, MD  Multiple Vitamin (MULTIVITAMIN) tablet  Take 1 tablet by mouth daily.    [provider]  vitamin C (ASCORBIC ACID) 500 MG tablet Take 500 mg by mouth daily.    [provider]    Family History History reviewed. No pertinent family history.  Social History Social History  Substance Use Topics  . Smoking status: Never Smoker  . Smokeless tobacco: Never Used  . Alcohol use Yes     Comment: occasionally throughout year     Allergies   Patient has no known allergies.   Review of Systems Review of Systems  Genitourinary: Positive for flank pain.  All other systems reviewed and are negative.    Physical Exam Updated Vital Signs BP (!) 186/126 (BP Location: Left Arm)   Pulse (!) 109   Temp 99 F (37.2 C) (Oral)   Resp 18   Ht 5\' 2"  (1.575 m)   Wt 169 lb (76.7 kg)   SpO2 100%   BMI 30.91 kg/m   Physical Exam  Constitutional: She is oriented to person, place, and time. She appears well-developed and well-nourished. No distress.  HENT:  Head: Normocephalic and atraumatic.  Neck: Normal range of motion. Neck supple.  Cardiovascular: Normal rate and regular rhythm.  Exam reveals no gallop and no friction rub.   No murmur heard. Pulmonary/Chest: Effort normal and breath sounds normal. No respiratory distress. She has no wheezes.  Abdominal: Soft. Bowel sounds are normal. She exhibits no distension. There is  no tenderness.  Musculoskeletal: Normal range of motion.  Neurological: She is alert and oriented to person, place, and time.  DTRs are 2+ and symmetrical in the patellar and Achilles tendons bilaterally. Strength is 5 out of 5 in both lower extremities. She is able to handle it on her heels and toes without difficulty.  Skin: Skin is warm and dry. She is not diaphoretic.  Nursing note and vitals reviewed.    ED Treatments / Results  Labs (all labs ordered are listed, but only abnormal results are displayed) Labs Reviewed  URINALYSIS, ROUTINE W REFLEX MICROSCOPIC  PREGNANCY, URINE     EKG  EKG Interpretation None       Radiology No results found.  Procedures Procedures (including critical care time)  Medications Ordered in ED Medications - No data to display   Initial Impression / Assessment and Plan / ED Course  I have reviewed the triage vital signs and the nursing notes.  Pertinent labs & imaging results that were available during my care of the patient were reviewed by me and considered in my medical decision making (see chart for details).  This pain appears to be musculoskeletal in nature. Her urine is clear. She will be treated with NSAIDs, Flexeril, and follow-up as needed.  Final Clinical Impressions(s) / ED Diagnoses   Final diagnoses:  None    New Prescriptions New Prescriptions   No medications on file     Veryl Speak, MD 07/11/16 1028

## 2016-07-11 NOTE — ED Triage Notes (Signed)
Patient states that her right back and side is hurting. Has hurt over the last week. The patient reports it comes and goes. Denies the pain at this time

## 2016-07-11 NOTE — Discharge Instructions (Signed)
Naproxen as prescribed.  Flexeril as prescribed as needed for pain not relieved with naproxen.  Follow-up with your primary Dr. if you're not improving in the next week, and return to the ER if symptoms significantly worsen or change.

## 2016-08-15 ENCOUNTER — Ambulatory Visit: Payer: BLUE CROSS/BLUE SHIELD | Admitting: Family

## 2016-08-22 ENCOUNTER — Telehealth: Payer: Self-pay | Admitting: Behavioral Health

## 2016-08-22 NOTE — Telephone Encounter (Signed)
Unable to reach patient for Pre-Visit Call. Per recording, the patient cannot accept any calls at this time.

## 2016-08-24 ENCOUNTER — Ambulatory Visit (INDEPENDENT_AMBULATORY_CARE_PROVIDER_SITE_OTHER): Payer: BLUE CROSS/BLUE SHIELD | Admitting: Family

## 2016-08-24 ENCOUNTER — Encounter: Payer: Self-pay | Admitting: Family

## 2016-08-24 VITALS — BP 164/103 | HR 109 | Temp 98.3°F | Resp 16 | Ht 64.0 in | Wt 169.4 lb

## 2016-08-24 DIAGNOSIS — E663 Overweight: Secondary | ICD-10-CM | POA: Diagnosis not present

## 2016-08-24 DIAGNOSIS — N6489 Other specified disorders of breast: Secondary | ICD-10-CM

## 2016-08-24 DIAGNOSIS — I10 Essential (primary) hypertension: Secondary | ICD-10-CM | POA: Diagnosis not present

## 2016-08-24 LAB — BASIC METABOLIC PANEL
BUN: 16 mg/dL (ref 6–23)
CO2: 29 mEq/L (ref 19–32)
CREATININE: 1.03 mg/dL (ref 0.40–1.20)
Calcium: 9.3 mg/dL (ref 8.4–10.5)
Chloride: 104 mEq/L (ref 96–112)
GFR: 76.89 mL/min (ref >60.00)
Glucose, Bld: 86 mg/dL (ref 70–99)
Potassium: 3.4 mEq/L — ABNORMAL LOW (ref 3.5–5.1)
Sodium: 137 mEq/L (ref 135–145)

## 2016-08-24 MED ORDER — AMLODIPINE BESYLATE 5 MG PO TABS: 5.0000 mg | ORAL_TABLET | Freq: Every day | ORAL | 1 refills | Status: DC

## 2016-08-24 NOTE — Progress Notes (Signed)
Subjective:    Patient ID: Nicole Larson, female    DOB: Aug 14, 1975, 41 y.o.   MRN: [id number]  HPI  Nicole Larson is a 41 yr old female who presents today to establish care. She has two concerns today which she would like to address:  1) HTN- She was started on hctz by the ER 1 month ago. Believes that there is a family history of HTN on both sides of her family.  BP Readings from Last 3 Encounters:  08/24/16 (!) 164/103  07/11/16 (!) 170/121  02/14/16 137/91   2) Breast Asymmetry- Reports that she note the left breast to be larger than the right.  3) Overweight- BMI is 29. She is interested in losing weight.   Reports that over the last year her weight has been stable. Diet could be better.  She is trying to eat less fried foods, pork,  Not exercising.   Review of Systems  Constitutional: Negative for unexpected weight change.  HENT: Negative for hearing loss and rhinorrhea.   Eyes: Negative for visual disturbance.  Respiratory: Negative for cough.   Cardiovascular: Negative for leg swelling.  Gastrointestinal: Negative for constipation and diarrhea.  Genitourinary: Negative for dysuria, frequency and menstrual problem.  Musculoskeletal:       Intermittent right elbow pain  Neurological: Negative for headaches.  Hematological: Negative for adenopathy.  Psychiatric/Behavioral:       Denies current depression, does have some anxiety about driving on the highway   Past Medical History:  Diagnosis Date  . Hypertension      Social History   Social History  . Marital status: Married    Spouse name: N/A  . Number of children: N/A  . Years of education: N/A   Occupational History  . Not on file.   Social History Main Topics  . Smoking status: Never Smoker  . Smokeless tobacco: Never Used  . Alcohol use Yes     Comment: occasionally throughout year  . Drug use: No  . Sexual activity: Not Currently    Birth control/ protection: Surgical   Other Topics Concern  .  Not on file   Social History Narrative   Works as a Quarry manager at [employer]   She will return to school at [employer] for nursing   4 children (1 daughter and 3 sons) Her 3 sons live at home   Has one granddaughter age 66   In process of divorce   Enjoys spending time with her children, shopping, netflix, going out, vacation       Past Surgical History:  Procedure Laterality Date  . TUBAL LIGATION      Family History  Problem Relation Age of Onset  . Sickle cell anemia Father   . Diabetes Maternal Grandmother   . Hypertension Maternal Grandmother   . Diabetes Maternal Grandfather   . Hypertension Maternal Grandfather   . Diabetes Paternal Grandmother   . Diabetes Paternal Grandfather     No Known Allergies  Current Outpatient Prescriptions on File Prior to Visit  Medication Sig Dispense Refill  . hydrochlorothiazide (HYDRODIURIL) 25 MG tablet Take 1 tablet (25 mg total) by mouth daily. 30 tablet 1  . Multiple Vitamin (MULTIVITAMIN) tablet Take 1 tablet by mouth daily.     No current facility-administered medications on file prior to visit.     BP (!) 164/103 (BP Location: Left Arm, Cuff Size: Normal)   Pulse (!) 109   Temp 98.3 F (36.8 C) (Oral)  Resp 16   Ht 5\' 4"  (1.626 m)   Wt 169 lb 6.4 oz (76.8 kg)   LMP 08/24/2016 (Exact Date)   SpO2 100%   BMI 29.08 kg/m       Objective:   Physical Exam  Constitutional: She is oriented to person, place, and time. She appears well-developed and well-nourished.  HENT:  Head: Normocephalic and atraumatic.  Neck: Neck supple. No thyromegaly present.  Cardiovascular: Normal rate, regular rhythm and normal heart sounds.   No murmur heard. Pulmonary/Chest: Effort normal and breath sounds normal. No respiratory distress. She has no wheezes.  Musculoskeletal: She exhibits no edema.  Lymphadenopathy:    She has no cervical adenopathy.  Neurological: She is alert and oriented to person, place, and time.  Skin: Skin is  warm and dry.  Psychiatric: She has a normal mood and affect. Her behavior is normal. Judgment and thought content normal.  Breast: bilateral breast exam is normal. No palpable masses. Left breast is slightly larger than the right breast       Assessment & Plan:  Breast asymmetry- advised pt normal exam.  It is common for women to have one breast larger than the other. Plan routine mammograms beginning at age 64.

## 2016-08-24 NOTE — Patient Instructions (Addendum)
Please complete lab work prior to leaving.  Work on adding regular exercise. Goal 30 minutes 5 days a week.  Have a lean protein and a fresh fruit or vegetable with every meal. Avoid sugared beverages, instead drink water.  Begin amlodipine once daily for your blood pressure.  Welcome to [facility]!

## 2016-08-25 DIAGNOSIS — E663 Overweight: Secondary | ICD-10-CM | POA: Insufficient documentation

## 2016-08-25 NOTE — Assessment & Plan Note (Signed)
Uncontrolled. Add amlodipine 5mg  once daily, continue hctz, obtain follow up bmet.

## 2016-08-25 NOTE — Assessment & Plan Note (Signed)
Discussed healthy diet, exercise and weight loss.  °

## 2016-08-26 ENCOUNTER — Telehealth: Payer: Self-pay | Admitting: Family

## 2016-08-26 DIAGNOSIS — E876 Hypokalemia: Secondary | ICD-10-CM

## 2016-08-26 MED ORDER — POTASSIUM CHLORIDE CRYS ER 20 MEQ PO TBCR: 20.0000 meq | EXTENDED_RELEASE_TABLET | Freq: Every day | ORAL | 3 refills | Status: DC

## 2016-08-26 NOTE — Telephone Encounter (Signed)
Called patient and made her aware and is in agreement with new rx and labs.  However, pt did not want to schedule an appt at this time.  She needs to check with insurance company first to see if there is a co-pay.  Pt states if there is a co-pay she will need to come back in 2 weeks instead of one week due to cost.

## 2016-08-26 NOTE — Telephone Encounter (Signed)
Potassium is low. Add kdur once daily, repeat bmet in 1 week. Dx hypokalemia.

## 2016-08-29 NOTE — Telephone Encounter (Signed)
Noted  

## 2016-09-12 ENCOUNTER — Emergency Department (HOSPITAL_BASED_OUTPATIENT_CLINIC_OR_DEPARTMENT_OTHER)
Admission: EM | Admit: 2016-09-12 | Discharge: 2016-09-12 | Disposition: A | Discharge status: Home / Self Care | Payer: BLUE CROSS/BLUE SHIELD | Attending: Emergency Medicine | Admitting: Emergency Medicine

## 2016-09-12 ENCOUNTER — Encounter (HOSPITAL_BASED_OUTPATIENT_CLINIC_OR_DEPARTMENT_OTHER): Payer: Self-pay | Admitting: *Deleted

## 2016-09-12 DIAGNOSIS — Z79899 Other long term (current) drug therapy: Secondary | ICD-10-CM | POA: Insufficient documentation

## 2016-09-12 DIAGNOSIS — I10 Essential (primary) hypertension: Secondary | ICD-10-CM | POA: Diagnosis not present

## 2016-09-12 DIAGNOSIS — R030 Elevated blood-pressure reading, without diagnosis of hypertension: Secondary | ICD-10-CM

## 2016-09-12 NOTE — ED Notes (Signed)
MD with pt  

## 2016-09-12 NOTE — ED Provider Notes (Signed)
[place] DEPT [facility] Provider Note   CSN: [id number] Arrival date & time: 09/12/16  0240     History   Chief Complaint Chief Complaint  Patient presents with  . blood pressure elevated    HPI Ottis Sarnowski is a 41 y.o. female.  HPI Patient reports Sunday morning she began having lightheadedness and headache and blurred vision.  She felt hot.  No fevers or chills.  Denies nausea vomiting diarrhea.  No chest pain shortness of breath.  Denies recent upper respiratory tract infection.  No blood in her stool.  Reports that she did check her blood pressure at work this evening and noticed it was in the 160s and was concerned.  She recently had a second blood pressure medication added one week ago.  On arrival to emergency department the patient reports all her symptoms resolved and she feels back to normal now.  She has no complaints at this time.   Past Medical History:  Diagnosis Date  . Hypertension     Patient Active Problem List   Diagnosis Date Noted  . Overweight (BMI 25.0-29.9) 08/25/2016  . Hypertension 08/24/2016    Past Surgical History:  Procedure Laterality Date  . TUBAL LIGATION      OB History    No data available       Home Medications    Prior to Admission medications   Medication Sig Start Date End Date Taking? Authorizing Provider  amLODipine (NORVASC) 5 MG tablet Take 1 tablet (5 mg total) by mouth daily. 08/24/16   [name], NP  Biotin 1000 MCG tablet Take 1,000 mcg by mouth daily.    [provider]  hydrochlorothiazide (HYDRODIURIL) 25 MG tablet Take 1 tablet (25 mg total) by mouth daily. 07/11/16   Veryl Speak, MD  Multiple Vitamin (MULTIVITAMIN) tablet Take 1 tablet by mouth daily.    [provider]  OVER THE COUNTER MEDICATION BLACKSEED OIL.  Take 2 teaspoons a day with honey.    [provider]  potassium chloride SA (K-DUR,KLOR-CON) 20 MEQ tablet Take 1 tablet (20 mEq total) by mouth daily. 08/26/16    [name], NP    Family History Family History  Problem Relation Age of Onset  . Sickle cell anemia Father   . Diabetes Maternal Grandmother   . Hypertension Maternal Grandmother   . Diabetes Maternal Grandfather   . Hypertension Maternal Grandfather   . Diabetes Paternal Grandmother   . Diabetes Paternal Grandfather     Social History Social History  Substance Use Topics  . Smoking status: Never Smoker  . Smokeless tobacco: Never Used  . Alcohol use Yes     Comment: occasionally throughout year     Allergies   Patient has no known allergies.   Review of Systems Review of Systems  All other systems reviewed and are negative.    Physical Exam Updated Vital Signs BP (!) 137/96 (BP Location: Left Arm)   Pulse 99   Temp 98.9 F (37.2 C) (Oral)   Resp 18   Ht 5\' 2"  (1.575 m)   Wt 76.7 kg (169 lb)   LMP 08/24/2016 (Exact Date)   SpO2 100%   BMI 30.91 kg/m   Physical Exam  Constitutional: She is oriented to person, place, and time. She appears well-developed and well-nourished. No distress.  HENT:  Head: Normocephalic and atraumatic.  Eyes: EOM are normal.  Neck: Normal range of motion.  Cardiovascular: Normal rate, regular rhythm and normal heart sounds.   Pulmonary/Chest:  Effort normal and breath sounds normal.  Abdominal: Soft. She exhibits no distension. There is no tenderness.  Musculoskeletal: Normal range of motion.  Neurological: She is alert and oriented to person, place, and time.  Skin: Skin is warm and dry.  Psychiatric: She has a normal mood and affect. Judgment normal.  Nursing note and vitals reviewed.    ED Treatments / Results  Labs (all labs ordered are listed, but only abnormal results are displayed) Labs Reviewed - No data to display  EKG  EKG Interpretation None       Radiology No results found.  Procedures Procedures (including critical care time)  Medications Ordered in ED Medications - No data to  display   Initial Impression / Assessment and Plan / ED Course  I have reviewed the triage vital signs and the nursing notes.  Pertinent labs & imaging results that were available during my care of the patient were reviewed by me and considered in my medical decision making (see chart for details).     Well-appearing.  Patient without symptoms at this time.  Doubt life-threatening illness.  Medical screening examination completed.  Close primary care follow-up.  Patient understands return to the ER for new or worsening symptoms   Final Clinical Impressions(s) / ED Diagnoses   Final diagnoses:  Elevated blood pressure reading    New Prescriptions New Prescriptions   No medications on file     Jola Schmidt, MD 09/12/16 [phone]

## 2016-09-12 NOTE — ED Triage Notes (Signed)
Pt c/o not feeling well Sunday morning at 5am while at work. States she felt light headed. Checked b/p at work and sbp was 140s. Pt states around 7pm she started having a headache, blurred vision, and felt hot. Checked her blood pressure at work and sbp was in the 160s She recently had a change to her bp meds. HCTZ was added one week ago. Denies any symptoms at present. States she feels much better.

## 2016-12-19 DIAGNOSIS — T148XXA Other injury of unspecified body region, initial encounter: Secondary | ICD-10-CM | POA: Diagnosis not present

## 2016-12-19 DIAGNOSIS — M542 Cervicalgia: Secondary | ICD-10-CM | POA: Diagnosis not present

## 2017-01-30 ENCOUNTER — Telehealth: Payer: Self-pay | Admitting: Family

## 2017-01-30 NOTE — Telephone Encounter (Signed)
Copied from [place] [id number]. Topic: General - Other >> Jan 30, 2017  2:05 PM Darl Householder, RMA wrote: Reason for CRM: Medication refill request for amlodipine 5 mg and hydrochlorothiazide 25 mg to be sent to [facility] [address]

## 2017-01-31 ENCOUNTER — Other Ambulatory Visit: Payer: Self-pay | Admitting: Family

## 2017-01-31 MED ORDER — AMLODIPINE BESYLATE 5 MG PO TABS: 5.0000 mg | ORAL_TABLET | Freq: Every day | ORAL | 0 refills | Status: DC

## 2017-01-31 MED ORDER — HYDROCHLOROTHIAZIDE 25 MG PO TABS: 25.0000 mg | ORAL_TABLET | Freq: Every day | ORAL | 0 refills | Status: DC

## 2017-01-31 NOTE — Telephone Encounter (Signed)
Received call from [facility] stating pt was on the phone stating she is out of HCTZ and [facility] did not get it. Upon chart review HCTZ rx did not transmit to pharmacy.  Rx re-sent.

## 2017-01-31 NOTE — Telephone Encounter (Signed)
Notified pt that medication would be filled and sent to [facility] as requested. Pt has appt scheduled on 12/31.

## 2017-02-24 ENCOUNTER — Telehealth: Payer: Self-pay | Admitting: Family

## 2017-02-24 NOTE — Telephone Encounter (Signed)
Can be discussed w/ PCP at time of appt.
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
## 2017-02-27 ENCOUNTER — Encounter: Payer: Self-pay | Admitting: Family

## 2017-02-27 ENCOUNTER — Ambulatory Visit (INDEPENDENT_AMBULATORY_CARE_PROVIDER_SITE_OTHER): Payer: BLUE CROSS/BLUE SHIELD | Admitting: Family

## 2017-02-27 ENCOUNTER — Other Ambulatory Visit: Payer: Self-pay | Admitting: Family

## 2017-02-27 VITALS — BP 133/88 | HR 108 | Temp 98.4°F | Resp 16 | Ht 64.0 in | Wt 168.4 lb

## 2017-02-27 DIAGNOSIS — E876 Hypokalemia: Secondary | ICD-10-CM | POA: Diagnosis not present

## 2017-02-27 DIAGNOSIS — M25561 Pain in right knee: Secondary | ICD-10-CM | POA: Diagnosis not present

## 2017-02-27 DIAGNOSIS — I10 Essential (primary) hypertension: Secondary | ICD-10-CM | POA: Diagnosis not present

## 2017-02-27 LAB — BASIC METABOLIC PANEL
BUN: 15 mg/dL (ref 6–23)
CHLORIDE: 100 meq/L (ref 96–112)
CO2: 31 mEq/L (ref 19–32)
Calcium: 9 mg/dL (ref 8.4–10.5)
Creatinine, Ser: 0.98 mg/dL (ref 0.40–1.20)
GFR: 81.22 mL/min (ref >60.00)
GLUCOSE: 134 mg/dL — AB (ref 70–99)
POTASSIUM: 3.1 meq/L — AB (ref 3.5–5.1)
Sodium: 139 mEq/L (ref 135–145)

## 2017-02-27 MED ORDER — AMLODIPINE BESYLATE 5 MG PO TABS: 5.0000 mg | ORAL_TABLET | Freq: Every day | ORAL | 5 refills | Status: DC

## 2017-02-27 MED ORDER — POTASSIUM CHLORIDE 20 MEQ/15ML (10%) PO SOLN: 20.0000 meq | Freq: Every day | ORAL | 5 refills | Status: DC

## 2017-02-27 MED ORDER — HYDROCHLOROTHIAZIDE 25 MG PO TABS: 25.0000 mg | ORAL_TABLET | Freq: Every day | ORAL | 5 refills | Status: DC

## 2017-02-27 NOTE — Patient Instructions (Addendum)
Please change from potassium pills to liquid. Continue current meds.  You will be contacted about your referral to orthopedics. Happy New Year!

## 2017-02-27 NOTE — Progress Notes (Signed)
bmet  
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
## 2017-03-01 NOTE — Addendum Note (Signed)
Addended by: Jiles Prows on: 03/01/2017 08:48 AM   Modules accepted: Orders

## 2017-03-07 ENCOUNTER — Encounter (INDEPENDENT_AMBULATORY_CARE_PROVIDER_SITE_OTHER): Payer: Self-pay | Admitting: Orthopaedic Surgery

## 2017-03-07 ENCOUNTER — Ambulatory Visit (INDEPENDENT_AMBULATORY_CARE_PROVIDER_SITE_OTHER): Payer: Self-pay | Admitting: Orthopaedic Surgery

## 2017-03-07 DIAGNOSIS — G8929 Other chronic pain: Secondary | ICD-10-CM

## 2017-03-07 DIAGNOSIS — M25561 Pain in right knee: Secondary | ICD-10-CM
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
## 2017-04-24 ENCOUNTER — Encounter: Payer: Self-pay | Admitting: Family

## 2017-04-24 ENCOUNTER — Ambulatory Visit: Payer: PRIVATE HEALTH INSURANCE | Admitting: Family

## 2017-04-24 VITALS — BP 172/92 | HR 120 | Temp 99.5°F | Resp 16 | Ht 64.0 in | Wt 167.4 lb

## 2017-04-24 DIAGNOSIS — H6692 Otitis media, unspecified, left ear: Secondary | ICD-10-CM | POA: Diagnosis not present

## 2017-04-24 DIAGNOSIS — I10 Essential (primary) hypertension: Secondary | ICD-10-CM

## 2017-04-24 MED ORDER — POTASSIUM CHLORIDE CRYS ER 20 MEQ PO TBCR: 20.0000 meq | EXTENDED_RELEASE_TABLET | Freq: Every day | ORAL | 3 refills | Status: DC

## 2017-04-24 MED ORDER — AMOXICILLIN 500 MG PO CAPS: 500.0000 mg | ORAL_CAPSULE | Freq: Three times a day (TID) | ORAL | 0 refills | Status: DC

## 2017-04-24 NOTE — Progress Notes (Signed)
Subjective:    Patient ID: Nicole Larson, female    DOB: 04-17-1975, 42 y.o.   MRN: [id number]  HPI  Patient is a 42 year old female who presents today with multiple symptoms.  She reports that 2 days ago she developed hot/chills, clammy feeling, aching, throat hurting.  She did drink a smoothie today.   Review of Systems See HPI  Past Medical History:  Diagnosis Date  . Hypertension      Social History   Socioeconomic History  . Marital status: Married    Spouse name: Not on file  . Number of children: Not on file  . Years of education: Not on file  . Highest education level: Not on file  Social Needs  . Financial resource strain: Not on file  . Food insecurity - worry: Not on file  . Food insecurity - inability: Not on file  . Transportation needs - medical: Not on file  . Transportation needs - non-medical: Not on file  Occupational History  . Not on file  Tobacco Use  . Smoking status: Never Smoker  . Smokeless tobacco: Never Used  Substance and Sexual Activity  . Alcohol use: Yes    Comment: occasionally throughout year  . Drug use: No  . Sexual activity: Not Currently    Birth control/protection: Surgical  Other Topics Concern  . 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 TUBAL LIGATION      Family History  Problem Relation Age of Onset  . Sickle cell anemia Father   . Diabetes Maternal Grandmother   . Hypertension Maternal Grandmother   . Diabetes Maternal Grandfather   . Hypertension Maternal Grandfather   . Diabetes Paternal Grandmother   . Diabetes Paternal Grandfather     No Known  Allergies  Current Outpatient Medications on File Prior to Visit  Medication Sig Dispense Refill  . amLODipine (NORVASC) 5 MG tablet Take 1 tablet (5 mg total) by mouth daily. 30 tablet 5  . Biotin 1000 MCG tablet Take 1,000 mcg by mouth daily.    . hydrochlorothiazide (HYDRODIURIL) 25 MG tablet Take 1 tablet (25 mg total) by mouth daily. 30 tablet 5  . Multiple Vitamin (MULTIVITAMIN) tablet Take 1 tablet by mouth daily.    Marland Kitchen OVER THE COUNTER MEDICATION BLACKSEED OIL.  Take 2 teaspoons a day with honey.    . potassium chloride 20 MEQ/15ML (10%) SOLN Take 15 mLs (20 mEq total) by mouth daily. (Patient not taking: Reported on 04/24/2017) 473 mL 5   No current facility-administered medications on file prior to visit.     BP (!) 172/92 (BP Location: Right Arm, Patient Position: Sitting, Cuff Size: Normal)   Pulse (!) 120   Temp 99.5 F (37.5 C) (Oral)   Resp 16   Ht 5\' 4"  (1.626 m)   Wt 167 lb 6.4 oz (75.9 kg)   SpO2 98%   BMI 28.73 kg/m       Objective:   Physical Exam  Constitutional: She is oriented to person, place, and time. She appears well-developed and well-nourished.  Sick  appearing AA female  HENT:  Head: Normocephalic and atraumatic.  Right Ear: Tympanic membrane and ear canal normal.  Left Ear: Ear canal normal. Tympanic membrane is erythematous.  3+ tonsils bilaterally, no exudates, moderate erythema  Cardiovascular: Normal heart sounds. Tachycardia present.  No murmur heard. Pulmonary/Chest: Effort normal and breath sounds normal. No respiratory distress. She has no wheezes. She has no rales. She exhibits no tenderness.  Lymphadenopathy:    She has no cervical adenopathy.  Neurological: She is alert and oriented to person, place, and time.  Psychiatric: She has a normal mood and affect. Her behavior is normal. Judgment and thought content normal.          Assessment & Plan:  Left otitis media- rapid strep negative, rapid flu negative.  Rx with amoxicillin.   She has a low-grade temp today and is tachycardic.  I suspect this is related to some associated dehydration due to her sore throat.  She is tolerating p.o.'s.  I have advised her to increase her fluid intake today is much as possible.  She understands that if she is unable to keep down fluids or if she feels worse she should go to the emergency department.  In the meantime I have advised her to use Tylenol or Motrin every 6 hours.  Hypertension-she did take Alka-Seltzer cold which likely raised her blood pressure.  I have advised her to DC Alka-Seltzer cold and instead use Tylenol or Motrin as needed.  We will plan to bring her back in 1 week for follow-up and repeat her blood pressure at that time. She requests to switch back to kdur tabs instead of liquid.  Rx sent.

## 2017-04-24 NOTE — Patient Instructions (Addendum)
Please begin amoxicillin for your ear infection. I think you are dehydrated- please push the fluids today and also take tylenol or motrin every 6 hours for the next few days. If you are unable to keep down fluids or if your symptoms worsen, please go to the ER.

## 2017-04-25 ENCOUNTER — Ambulatory Visit: Payer: Self-pay

## 2017-04-25 NOTE — Telephone Encounter (Signed)
  Answer Assessment - Initial Assessment Questions 1. ANTIBIOTIC: "What antibiotic are you receiving?" "How many times per day?"     Amoxicilin 2. ONSET: "When was the antibiotic started?"     yesterday 3. PAIN: "How bad is the pain?"   (Scale 1-10; mild, moderate or severe)   - MILD (1-3): doesn't interfere with normal activities    - MODERATE (4-7): interferes with normal activities or awakens from sleep    - SEVERE (8-10): excruciating pain, unable to do any normal activities     4. FEVER: "Do you have a fever?" If so, ask: "What is your temperature, how was it measured, and when did it start?"     No fever 5. DISCHARGE: "Is there any discharge from the ear?"     no 6. OTHER SYMPTOMS: "Do you have any other symptoms?" (e.g., headache, stiff neck, dizziness, vomiting, runny nose)     Feels "hot" having dry mouth 7. PREGNANCY: "Is there any chance you are pregnant?" "When was your last menstrual period?"  Answer Assessment - Initial Assessment Questions 1. REASON FOR CALL or QUESTION: "What is your reason for calling today?" or "How can I best help you?" or "What question do you have that I can help answer?"     Pt calling to ask when do the abx she was prescribed yesterday begin to work. Advised pt that oral ABX take 24 hours to begin to work.  Pt informed that she feels "hot" but her temp this am is afebrile at 97.4. Informed pt to call if she is having fever after having started the abx after 2-3 days.  Pt concerned that she is waking up in the middle of the night with her mouth dry. Advised pt to drink at least 8 glasses of water (non-caffeinated) per day and to keep water at her bedside in case she wakes up with dry mouth. Pt states her sore throat is better than yesterday.  Protocols used: EAR - OTITIS MEDIA FOLLOW-UP CALL-A-AH, INFORMATION ONLY CALL-A-AH

## 2017-04-26 ENCOUNTER — Encounter: Payer: Self-pay | Admitting: Family

## 2017-05-01 ENCOUNTER — Encounter: Payer: Self-pay | Admitting: Family

## 2017-05-01 ENCOUNTER — Ambulatory Visit (INDEPENDENT_AMBULATORY_CARE_PROVIDER_SITE_OTHER): Payer: PRIVATE HEALTH INSURANCE | Admitting: Family

## 2017-05-01 VITALS — BP 140/88 | HR 100 | Temp 98.6°F | Resp 16 | Ht 64.0 in | Wt 162.0 lb

## 2017-05-01 DIAGNOSIS — I10 Essential (primary) hypertension: Secondary | ICD-10-CM | POA: Diagnosis not present

## 2017-05-01 NOTE — Progress Notes (Signed)
Subjective:    Patient ID: Nicole Larson, female    DOB: [birth year], 42 y.o.   MRN: [id number]  HPI  Patient is a 42 year old female who presents today for blood pressure check.  We saw her on February 25 for left otitis media.   She had been taking some over-the-counter cold preparations and blood pressure was elevated.  She is currently maintained on amlodipine 5 mg once daily and hydrochlorothiazide 25 mg once daily. She is concerned about the amlodipine recall. Reports resolution of her otalgia.  BP Readings from Last 3 Encounters:  05/01/17 140/88  04/24/17 (!) 172/92  02/27/17 133/88      Review of Systems    see HPI  Past Medical History:  Diagnosis Date  . Hypertension      Social History   Socioeconomic History  . Marital status: Married    Spouse name: Not on file  . Number of children: Not on file  . Years of education: Not on file  . Highest education level: Not on file  Social Needs  . Financial resource strain: Not on file  . Food insecurity - worry: Not on file  . Food insecurity - inability: Not on file  . Transportation needs - medical: Not on file  . Transportation needs - non-medical: Not on file  Occupational History  . Not on file  Tobacco Use  . Smoking status: Never Smoker  . Smokeless tobacco: Never Used  Substance and Sexual Activity  . Alcohol use: Yes    Comment: occasionally throughout year  . Drug use: No  . Sexual activity: Not Currently    Birth control/protection: Surgical  Other Topics Concern  . 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 TUBAL LIGATION      Family History    Problem Relation Age of Onset  . Sickle cell anemia Father   . Diabetes Maternal Grandmother   . Hypertension Maternal Grandmother   . Diabetes Maternal Grandfather   . Hypertension Maternal Grandfather   . Diabetes Paternal Grandmother   . Diabetes Paternal Grandfather     No Known Allergies  Current Outpatient Medications on File Prior to Visit  Medication Sig Dispense Refill  . amLODipine (NORVASC) 5 MG tablet Take 1 tablet (5 mg total) by mouth daily. 30 tablet 5  . amoxicillin (AMOXIL) 500 MG capsule Take 1 capsule (500 mg total) by mouth 3 (three) times daily. 30 capsule 0  . Biotin 1000 MCG tablet Take 1,000 mcg by mouth daily.    . hydrochlorothiazide (HYDRODIURIL) 25 MG tablet Take 1 tablet (25 mg total) by mouth daily. 30 tablet 5  . Multiple Vitamin (MULTIVITAMIN) tablet Take 1 tablet by mouth daily.    Marland Kitchen OVER THE COUNTER MEDICATION BLACKSEED OIL.  Take 2 teaspoons a day with honey.    . potassium chloride SA (K-DUR,KLOR-CON) 20 MEQ tablet Take 1 tablet (20 mEq total) by mouth daily. 30 tablet 3   No current facility-administered medications on file prior to visit.     BP 140/88   Pulse 100  Temp 98.6 F (37 C) (Oral)   Resp 16   Ht 5\' 4"  (1.626 m)   Wt 162 lb (73.5 kg)   LMP 04/29/2017   SpO2 100%   BMI 27.81 kg/m    Objective:   Physical Exam  Constitutional: She is oriented to person, place, and time. She appears well-developed and well-nourished.  Cardiovascular: Normal rate, regular rhythm and normal heart sounds.  No murmur heard. Pulmonary/Chest: Effort normal and breath sounds normal. No respiratory distress. She has no wheezes.  Musculoskeletal: She exhibits no edema.  Neurological: She is alert and oriented to person, place, and time.  Skin: Skin is warm and dry.  Psychiatric: She has a normal mood and affect. Her behavior is normal. Judgment and thought content normal.          Assessment & Plan:

## 2017-05-01 NOTE — Assessment & Plan Note (Signed)
Blood pressure is improved. Advised pt to contact her pharmacy to ensure that the lot she was given for amlodipine is not on the recall list.  Will have patient continue current dose of amlodipine as well as hydrochlorothiazide.  Repeat blood pressure in 2 months.

## 2017-05-01 NOTE — Patient Instructions (Signed)
Please continue current meds. You can call your pharmacy to confirm that your amlodipine is not in a recalled batch.

## 2017-05-08 ENCOUNTER — Ambulatory Visit: Payer: PRIVATE HEALTH INSURANCE | Admitting: Family

## 2017-05-08 ENCOUNTER — Encounter: Payer: Self-pay | Admitting: Family

## 2017-05-08 ENCOUNTER — Ambulatory Visit: Payer: Self-pay | Admitting: *Deleted

## 2017-05-08 VITALS — BP 150/99 | HR 105 | Temp 98.4°F | Resp 16 | Ht 64.0 in | Wt 164.0 lb

## 2017-05-08 DIAGNOSIS — J029 Acute pharyngitis, unspecified: Secondary | ICD-10-CM

## 2017-05-08 DIAGNOSIS — J039 Acute tonsillitis, unspecified: Secondary | ICD-10-CM

## 2017-05-08 LAB — POCT RAPID STREP A (OFFICE): Rapid Strep A Screen: NEGATIVE

## 2017-05-08 MED ORDER — AMOXICILLIN-POT CLAVULANATE 875-125 MG PO TABS
1.0000 | ORAL_TABLET | Freq: Two times a day (BID) | ORAL | 0 refills | Status: DC
Start: 2017-05-08 — End: 2018-07-24

## 2017-05-08 NOTE — Progress Notes (Signed)
Subjective:    Patient ID: Nicole Larson, female    DOB: 05/10/1975, 42 y.o.   MRN: [id number]  HPI  Patient is a 42 yr old female with chief complaint of sore throat. Reports that this started 2 days ago. No other symptoms. Reports pain is 7.5/10.  No fever, no other symptoms. Has not tried any otc meds.    HTN- did not take her bp medication today. BP Readings from Last 3 Encounters:  05/08/17 (!) 150/99  05/01/17 140/88  04/24/17 (!) 172/92     Review of Systems See HPI  Past Medical History:  Diagnosis Date  . Hypertension      Social History   Socioeconomic History  . Marital status: Married    Spouse name: Not on file  . Number of children: Not on file  . Years of education: Not on file  . Highest education level: Not on file  Social Needs  . Financial resource strain: Not on file  . Food insecurity - worry: Not on file  . Food insecurity - inability: Not on file  . Transportation needs - medical: Not on file  . Transportation needs - non-medical: Not on file  Occupational History  . Not on file  Tobacco Use  . Smoking status: Never Smoker  . Smokeless tobacco: Never Used  Substance and Sexual Activity  . Alcohol use: Yes    Comment: occasionally throughout year  . Drug use: No  . Sexual activity: Not Currently    Birth control/protection: Surgical  Other Topics Concern  . 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 TUBAL LIGATION      Family History  Problem Relation Age of Onset  . Sickle cell anemia Father   . Diabetes Maternal Grandmother   . Hypertension Maternal Grandmother   . Diabetes Maternal  Grandfather   . Hypertension Maternal Grandfather   . Diabetes Paternal Grandmother   . Diabetes Paternal Grandfather     No Known Allergies  Current Outpatient Medications on File Prior to Visit  Medication Sig Dispense Refill  . amLODipine (NORVASC) 5 MG tablet Take 1 tablet (5 mg total) by mouth daily. 30 tablet 5  . amoxicillin (AMOXIL) 500 MG capsule Take 1 capsule (500 mg total) by mouth 3 (three) times daily. 30 capsule 0  . Biotin 1000 MCG tablet Take 1,000 mcg by mouth daily.    . hydrochlorothiazide (HYDRODIURIL) 25 MG tablet Take 1 tablet (25 mg total) by mouth daily. 30 tablet 5  . Multiple Vitamin (MULTIVITAMIN) tablet Take 1 tablet by mouth daily.    Marland Kitchen OVER THE COUNTER MEDICATION BLACKSEED OIL.  Take 2 teaspoons a day with honey.    . potassium chloride SA (K-DUR,KLOR-CON) 20 MEQ tablet Take 1 tablet (20 mEq total) by mouth daily. 30 tablet 3   No current facility-administered medications on file prior to visit.     BP (!) 150/99 (BP Location: Right Arm, Patient Position: Sitting, Cuff Size: Small)   Pulse (!) 105   Temp 98.4 F (36.9 C)   Resp 16  Ht 5\' 4"  (1.626 m)   Wt 164 lb (74.4 kg)   LMP 04/29/2017   SpO2 98%   BMI 28.15 kg/m       Objective:   Physical Exam  Constitutional: She is oriented to person, place, and time. She appears well-developed and well-nourished.  HENT:  Head: Normocephalic and atraumatic.  Right Ear: Tympanic membrane and ear canal normal.  Left Ear: Tympanic membrane and ear canal normal.  R tonsil 2-3+, L tonsil 3+ Uvula midline, mild erythema  Neck: Neck supple.  Cardiovascular: Normal rate, regular rhythm and normal heart sounds.  No murmur heard. Pulmonary/Chest: Effort normal and breath sounds normal. No respiratory distress. She has no wheezes.  Lymphadenopathy:    She has no cervical adenopathy.  Neurological: She is alert and oriented to person, place, and time.  Psychiatric: She has a normal mood and affect. Her  behavior is normal. Judgment and thought content normal.          Assessment & Plan:  Tonsillitis- rapid strep negative.  I am concerned about the fact that her left tonsil is larger than her right tonsil.  There is no exudate however.  She did have a recent left-sided otitis media.  We will plan empiric treatment with Augmentin. Case and plan have been discussed with Dr. Charlett Blake. I have advised the patient as follows: Start augmentin.   Call if new/worsening symptoms. If you have trouble swallowing, increased throat fullness/pain, fever >101 please go to the ER for further evaluation.   HTN- uncontrolled. Advised pt on importance of med compliance.

## 2017-05-08 NOTE — Patient Instructions (Addendum)
Stop amoxicillin, start augmentin.   Restart your blood pressure medicine.  Call if new/worsening symptoms. If you have trouble swallowing, increased throat fullness/pain, fever >101 please go to the ER for further evaluation.

## 2017-05-08 NOTE — Telephone Encounter (Signed)
Pt calling and asking if it was normal to experience pain shooting up to the left ear with throat pain on the left side. Pt was seen for an OV with [name] today and was prescribed antibiotics for tonsillitis.Explained to pt that with pain on the left side of her throat it is not uncommon for pain pain to radiate to other areas, including the ear. Pt also noted to have ear infection on the left side approximately 2 weeks ago.Pt states she has not picked up current prescription for antibiotic and states that she was just notified it was ready by pharmacy while on the call with the nurse. Advised pt to pick up current prescription from the pharmacy to began treatment of current symptoms and if symptoms worsened and she started to have an increase I pain or trouble swallowing to seek treatment in the ED. Pt verbalized understanding.

## 2017-05-15 ENCOUNTER — Ambulatory Visit: Payer: PRIVATE HEALTH INSURANCE | Admitting: Family

## 2017-05-15 ENCOUNTER — Telehealth: Payer: Self-pay | Admitting: Family

## 2017-05-15 DIAGNOSIS — Z0289 Encounter for other administrative examinations: Secondary | ICD-10-CM

## 2017-05-15 NOTE — Telephone Encounter (Signed)
Copied from [place] [id number]. Topic: Quick Communication - See Telephone Encounter >> May 15, 2017  8:35 AM Nicole Larson wrote: CRM for notification. See Telephone encounter for:  05/15/17.  Pt came in late for her appt 05-15-2017 (8:23) since pt received news losing a family member, per [facility] do not charge no show fee.

## 2017-05-17 ENCOUNTER — Ambulatory Visit: Payer: PRIVATE HEALTH INSURANCE | Admitting: Family

## 2017-05-30 ENCOUNTER — Encounter: Payer: Self-pay | Admitting: Family

## 2017-07-04 ENCOUNTER — Ambulatory Visit: Payer: PRIVATE HEALTH INSURANCE | Admitting: Family

## 2018-01-10 ENCOUNTER — Encounter (HOSPITAL_BASED_OUTPATIENT_CLINIC_OR_DEPARTMENT_OTHER): Payer: Self-pay | Admitting: Emergency Medicine

## 2018-01-10 ENCOUNTER — Other Ambulatory Visit: Payer: Self-pay

## 2018-01-10 ENCOUNTER — Emergency Department (HOSPITAL_BASED_OUTPATIENT_CLINIC_OR_DEPARTMENT_OTHER)
Admission: EM | Admit: 2018-01-10 | Discharge: 2018-01-10 | Disposition: A | Discharge status: Home / Self Care | Payer: PRIVATE HEALTH INSURANCE | Attending: Emergency Medicine | Admitting: Emergency Medicine

## 2018-01-10 DIAGNOSIS — Z79899 Other long term (current) drug therapy: Secondary | ICD-10-CM | POA: Insufficient documentation

## 2018-01-10 DIAGNOSIS — I10 Essential (primary) hypertension: Secondary | ICD-10-CM | POA: Insufficient documentation

## 2018-01-10 DIAGNOSIS — K047 Periapical abscess without sinus: Secondary | ICD-10-CM | POA: Insufficient documentation

## 2018-01-10 MED ORDER — AMOXICILLIN 500 MG PO CAPS: 500.0000 mg | ORAL_CAPSULE | Freq: Two times a day (BID) | ORAL | 0 refills | Status: DC

## 2018-01-10 MED ORDER — NAPROXEN 500 MG PO TABS: 500.0000 mg | ORAL_TABLET | Freq: Two times a day (BID) | ORAL | 0 refills | Status: DC

## 2018-01-10 MED FILL — NAPROXEN 500 MG TABLET: 500 | 15 days supply | Qty: 30 | Fill #0

## 2018-01-10 MED FILL — AMOXICILLIN 500 MG CAPSULE: 500 | 10 days supply | Qty: 21 | Fill #0

## 2018-01-10 NOTE — ED Notes (Signed)
Pt directed to pharmacy to pick up Rx 

## 2018-01-10 NOTE — ED Triage Notes (Signed)
Pt c/o dental pain, LT upper, x 1 wk

## 2018-01-10 NOTE — ED Provider Notes (Signed)
[facility] EMERGENCY DEPARTMENT Provider Note   CSN: [id number] Arrival date & time: 01/10/18  1023     History   Chief Complaint Chief Complaint  Patient presents with  . Dental Pain    HPI Nicole Larson is a 42 y.o. female with history of dental carries presents for evaluation of dental pain.  Patient states she has had pain to her left upper tooth for approximately 5 days.  Patient states she has had issues with this tooth previously.  Patient was told that she needed to have the tooth removed however she cannot afford this.  Patient with pain rated a 7/10.  She states she has noted a "funny taste in her mouth."  Patient admits to a "lump on my gums."  Denies fever, chills, nausea, vomiting, swelling, elevation of palate, neck pain or neck stiffness.  Admits to a history of periapical abscess.  History obtained from patient. No interpretor was used.  HPI  Past Medical History:  Diagnosis Date  . Hypertension     Patient Active Problem List   Diagnosis Date Noted  . Overweight (BMI 25.0-29.9) 08/25/2016  . Hypertension 08/24/2016    Past Surgical History:  Procedure Laterality Date  . TUBAL LIGATION       OB History   None      Home Medications    Prior to Admission medications   Medication Sig Start Date End Date Taking? Authorizing Provider  amLODipine (NORVASC) 5 MG tablet Take 1 tablet (5 mg total) by mouth daily. 02/27/17   [name], NP  amoxicillin (AMOXIL) 500 MG capsule Take 1 capsule (500 mg total) by mouth 2 (two) times daily. 01/10/18   Henderly, Britni A, PA-C  amoxicillin-clavulanate (AUGMENTIN) 875-125 MG tablet Take 1 tablet by mouth 2 (two) times daily. 05/08/17   [name], NP  Biotin 1000 MCG tablet Take 1,000 mcg by mouth daily.    [provider]  hydrochlorothiazide (HYDRODIURIL) 25 MG tablet Take 1 tablet (25 mg total) by mouth daily. 02/27/17   [name], NP  Multiple Vitamin  (MULTIVITAMIN) tablet Take 1 tablet by mouth daily.    [provider]  naproxen (NAPROSYN) 500 MG tablet Take 1 tablet (500 mg total) by mouth 2 (two) times daily. 01/10/18   Henderly, Britni A, PA-C  OVER THE COUNTER MEDICATION BLACKSEED OIL.  Take 2 teaspoons a day with honey.    [provider]  potassium chloride SA (K-DUR,KLOR-CON) 20 MEQ tablet Take 1 tablet (20 mEq total) by mouth daily. 04/24/17   [name], NP    Family History Family History  Problem Relation Age of Onset  . Sickle cell anemia Father   . Diabetes Maternal Grandmother   . Hypertension Maternal Grandmother   . Diabetes Maternal Grandfather   . Hypertension Maternal Grandfather   . Diabetes Paternal Grandmother   . Diabetes Paternal Grandfather     Social History Social History   Tobacco Use  . Smoking status: Never Smoker  . Smokeless tobacco: Never Used  Substance Use Topics  . Alcohol use: Yes    Comment: occasionally throughout year  . Drug use: No     Allergies   Patient has no known allergies.   Review of Systems Review of Systems  Constitutional: Negative.   HENT: Positive for dental problem. Negative for congestion, drooling, facial swelling, nosebleeds, postnasal drip, rhinorrhea, sinus pressure, sinus pain, sneezing, sore throat, tinnitus, trouble swallowing and voice change.   Respiratory: Negative.   Cardiovascular:  Negative.   Gastrointestinal: Negative.   All other systems reviewed and are negative.    Physical Exam Updated Vital Signs BP (!) 146/92   Pulse 83   Temp 98.2 F (36.8 C) (Oral)   Resp 18   Ht 5\' 2"  (1.575 m)   Wt 77.1 kg   LMP 01/07/2018   SpO2 100%   BMI 31.09 kg/m   Physical Exam  Constitutional: Vital signs are normal. She appears well-developed and well-nourished.  Non-toxic appearance. She does not have a sickly appearance. She does not appear ill. No distress.  HENT:  Head: Normocephalic and atraumatic.  Right Ear:  Tympanic membrane, external ear and ear canal normal. Tympanic membrane is not injected, not scarred, not perforated, not erythematous, not retracted and not bulging.  Left Ear: Tympanic membrane, external ear and ear canal normal. Tympanic membrane is not injected, not scarred, not perforated, not erythematous, not retracted and not bulging.  Nose: Nose normal. No mucosal edema, rhinorrhea, sinus tenderness, nasal deformity or nasal septal hematoma. No epistaxis.  No foreign bodies. Right sinus exhibits no maxillary sinus tenderness and no frontal sinus tenderness. Left sinus exhibits no maxillary sinus tenderness and no frontal sinus tenderness.  Mouth/Throat: Uvula is midline, oropharynx is clear and moist and mucous membranes are normal. No oral lesions. No trismus in the jaw. Abnormal dentition. Dental abscesses and dental caries present. No uvula swelling or lacerations. No oropharyngeal exudate, posterior oropharyngeal edema, posterior oropharyngeal erythema or tonsillar abscesses. No tonsillar exudate.    Periapical abscess to tooth 12/13. Abscess actively draining. Mild surrounding erythema. No evidence of palate elevation, oropharyngeal erythema or edema. Patient with multiple dental carries as well as overall poor dentition.  No evidence of lesions or abscess to palate.  Eyes: Pupils are equal, round, and reactive to light.  Neck: Normal range of motion, full passive range of motion without pain and phonation normal. No neck rigidity. No edema, no erythema and normal range of motion present.  No neck pain or neck.  No submandibular swelling, brawny neck edema.  No evidence of tongue protrusion or elevation.  No drooling or dysphonia.  Cardiovascular: Normal rate, regular rhythm, normal heart sounds and intact distal pulses. Exam reveals no gallop and no friction rub.  No murmur heard. Pulmonary/Chest: Effort normal and breath sounds normal. No stridor. No respiratory distress. She has no  wheezes. She has no rales.  Abdominal: Soft. She exhibits no distension.  Musculoskeletal: Normal range of motion.  Neurological: She is alert.  Skin: Skin is warm and dry. She is not diaphoretic.  Psychiatric: She has a normal mood and affect.  Nursing note and vitals reviewed.    ED Treatments / Results  Labs (all labs ordered are listed, but only abnormal results are displayed) Labs Reviewed - No data to display  EKG None  Radiology No results found.  Procedures Procedures (including critical care time)  Medications Ordered in ED Medications - No data to display   Initial Impression / Assessment and Plan / ED Course  I have reviewed the triage vital signs and the nursing notes.  Pertinent labs & imaging results that were available during my care of the patient were reviewed by me and considered in my medical decision making (see chart for details).  42 year old female who appears otherwise well presents for evaluation of dental pain.  Pain is been present for 5 days.  Patient with multiple dental caries as well as overall poor dentition.  Patient admits to history of  periapical abscess.  Patient with currently draining periapical abscess to tooth 12/13.  No evidence of PTA, RPA, Ludwig's angina or deep space infection. Low suspicion for osteomyelitis. No tooth fracture or active tooth bleeding Given periapical abscess is actively draining do not feel she needs additional drainage at this time.  Will DC home with amoxicillin, naproxen and follow-up with dentist as soon as possible.  Afebrile, nonseptic, non-ill-appearing.  Patient is hemodynamically stable and appropriate for DC home at this time.  Discussed with patient strict return precautions as well as follow-up with dentist.  Patient voiced understanding return precautions and is agreeable for follow-up.    Final Clinical Impressions(s) / ED Diagnoses   Final diagnoses:  Periapical abscess    ED Discharge Orders          Ordered    amoxicillin (AMOXIL) 500 MG capsule  2 times daily     01/10/18 1211    naproxen (NAPROSYN) 500 MG tablet  2 times daily     01/10/18 1212           Henderly, Britni A, PA-C 01/10/18 2020    Quintella Reichert, MD 01/11/18 [phone]

## 2018-01-10 NOTE — Discharge Instructions (Addendum)
Evaluated today for dental abscess.  This area is currently draining.  Follow-up with a dentist within the next 24 hours.  Return to the ED for any new or worsening symptoms.

## 2018-01-19 ENCOUNTER — Telehealth: Payer: Self-pay | Admitting: *Deleted

## 2018-01-19 DIAGNOSIS — Z0000 Encounter for general adult medical examination without abnormal findings: Secondary | ICD-10-CM

## 2018-01-19 NOTE — Telephone Encounter (Signed)
Copied from [place] [id number]. Topic: General - Other >> Jan 18, 2018 11:17 AM Leward Quan A wrote: Patient called to request information on where she can be sent for a free mammogram. States that her insurance will be cancelled by the first of the year. Ph# [phone]

## 2018-01-19 NOTE — Telephone Encounter (Signed)
Why don't we do it before her insurance ends?  I have placed referral. After her insurance ends she can contact the breast center and inquire about a mammogram scholarship.

## 2018-01-19 NOTE — Telephone Encounter (Signed)
Notified pt. She states her insurance is already cancelled and will not be in affect until after the first of the year. Gave contact # to the Breast Center per below message. Pt states she will call back to schedule a follow up once her new insurance goes into effect.

## 2018-01-23 ENCOUNTER — Telehealth: Payer: Self-pay | Admitting: Family

## 2018-01-23 NOTE — Telephone Encounter (Signed)
Spoke with pt. She feels like her left breast is larger than her right breast but denies any masses.  I told her ok to proceed with mammogram. She is going to contact the breast center to discuss patient assistance since she is currently uninsured.

## 2018-01-27 ENCOUNTER — Ambulatory Visit (HOSPITAL_BASED_OUTPATIENT_CLINIC_OR_DEPARTMENT_OTHER)
Admission: RE | Admit: 2018-01-27 | Discharge: 2018-01-27 | Disposition: A | Discharge status: Home / Self Care | Payer: Self-pay | Source: Ambulatory Visit | Attending: Family | Admitting: Family

## 2018-01-27 ENCOUNTER — Encounter (INDEPENDENT_AMBULATORY_CARE_PROVIDER_SITE_OTHER): Payer: Self-pay

## 2018-01-27 DIAGNOSIS — Z0000 Encounter for general adult medical examination without abnormal findings: Secondary | ICD-10-CM | POA: Insufficient documentation

## 2018-07-06 ENCOUNTER — Other Ambulatory Visit: Payer: Self-pay | Admitting: Family

## 2018-07-06 NOTE — Telephone Encounter (Signed)
Copied from [place] [id number]. Topic: Quick Communication - Rx Refill/Question >> Jul 06, 2018 12:14 PM Percell Belt A wrote: Medication: amLODipine (NORVASC) 5 MG tablet [[id number]]  hydrochlorothiazide (HYDRODIURIL) 25 MG tablet [[id number]] Pt called in and is needed refills on these med.  Pt has not been seen since 2019. Advised pt she may need to make an appt.  She stated as of right not she does not have health ins and would have to pay out of pocket and can not afford it.  She has not been able to make her health ins payment.  Please advise what she needs to do   Has the patient contacted their pharmacy? No. (Agent: If no, request that the patient contact the pharmacy for the refill.) (Agent: If yes, when and what did the pharmacy advise?)  Preferred Pharmacy (with phone number or street name): [facility] [place]: Please be advised that RX refills may take up to 3 business days. We ask that you follow-up with your pharmacy.

## 2018-07-10 MED ORDER — HYDROCHLOROTHIAZIDE 25 MG PO TABS: 25.0000 mg | ORAL_TABLET | Freq: Every day | ORAL | 0 refills | Status: DC

## 2018-07-10 MED ORDER — AMLODIPINE BESYLATE 5 MG PO TABS: 5.0000 mg | ORAL_TABLET | Freq: Every day | ORAL | 0 refills | Status: DC

## 2018-07-10 NOTE — Telephone Encounter (Signed)
Please advise as patient does not want OV although she is due for one. She does not currently have insurance but is requesting refill.

## 2018-07-10 NOTE — Telephone Encounter (Signed)
I gave a 14 day refill. I have not seen her in >1 year. Unfortunately, I will need to see her before I can give additional refills.  She will receive a discounted price on the office visit since she is uninsured. We can do virtual visit but I will need her to check her blood pressure prior to the visit for review.

## 2018-07-12 NOTE — Telephone Encounter (Signed)
Will check bp at work

## 2018-07-12 NOTE — Telephone Encounter (Signed)
Was scheduled for 07-24-18 "may have insurance by then"

## 2018-07-24 ENCOUNTER — Ambulatory Visit (INDEPENDENT_AMBULATORY_CARE_PROVIDER_SITE_OTHER): Payer: BLUE CROSS/BLUE SHIELD | Admitting: Family

## 2018-07-24 ENCOUNTER — Other Ambulatory Visit: Payer: Self-pay

## 2018-07-24 DIAGNOSIS — I10 Essential (primary) hypertension: Secondary | ICD-10-CM | POA: Diagnosis not present

## 2018-07-24 MED ORDER — AMLODIPINE BESYLATE 10 MG PO TABS: 10.0000 mg | ORAL_TABLET | Freq: Every day | ORAL | 3 refills | Status: DC

## 2018-07-24 NOTE — Progress Notes (Signed)
Virtual Visit via Video Note  I connected with Murlean Caller on 07/24/18 at 11:20 AM EDT by a video enabled telemedicine application and verified that I am speaking with the correct person using two identifiers. This visit type was conducted due to national recommendations for restrictions regarding the COVID-19 Pandemic (e.g. social distancing).  This format is felt to be most appropriate for this patient at this time.   I discussed the limitations of evaluation and management by telemedicine and the availability of in person appointments. The patient expressed understanding and agreed to proceed.  Only the patient and myself were on today's video visit. The patient was at home and I was in my office at the time of today's visit.   History of Present Illness:   Patient is a 43 yr old female who presents today for follow up.  HTN- bp meds include amlodipine 5mg , hctz 25mg , kdur 20 mEQ.   Reports that she has been checking at work.  Reports last bp was 138/90.  Pt reports that she is taking hctz but not taking kdur. She denies hx of swelling.  BP Readings from Last 3 Encounters:  01/10/18 (!) 146/92  05/08/17 (!) 150/99  05/01/17 140/88   She had an ED visit for periapical abscess back in November. She notes that she has seen her dentist and needs a lot of dental work. She is trying to work on the financial aspect of this.     Observations/Objective:   Gen: Awake, alert, no acute distress Resp: Breathing is even and non-labored Psych: calm/pleasant demeanor Neuro: Alert and Oriented x 3, + facial symmetry, speech is clear.    Assessment and Plan:  HTN- bp is stable on current meds. Continue same. Will plan a 6 week follow up for physical with labs.  Follow Up Instructions:    I discussed the assessment and treatment plan with the patient. The patient was provided an opportunity to ask questions and all were answered. The patient agreed with the plan and demonstrated an  understanding of the instructions.   The patient was advised to call back or seek an in-person evaluation if the symptoms worsen or if the condition fails to improve as anticipated.    Nance Pear, NP

## 2018-09-07 ENCOUNTER — Encounter: Payer: BLUE CROSS/BLUE SHIELD | Admitting: Family

## 2018-10-09 ENCOUNTER — Encounter: Payer: BLUE CROSS/BLUE SHIELD | Admitting: Family

## 2018-12-11 ENCOUNTER — Encounter: Payer: Self-pay | Admitting: Family

## 2019-03-29 ENCOUNTER — Encounter: Payer: Self-pay | Admitting: Medical

## 2019-03-29 ENCOUNTER — Ambulatory Visit (INDEPENDENT_AMBULATORY_CARE_PROVIDER_SITE_OTHER): Payer: 59 | Admitting: Medical

## 2019-03-29 ENCOUNTER — Other Ambulatory Visit: Payer: Self-pay

## 2019-03-29 VITALS — Ht 62.0 in | Wt 160.0 lb

## 2019-03-29 DIAGNOSIS — F419 Anxiety disorder, unspecified: Secondary | ICD-10-CM | POA: Diagnosis not present

## 2019-03-29 MED ORDER — BUSPIRONE HCL 7.5 MG PO TABS: 7.5000 mg | ORAL_TABLET | Freq: Two times a day (BID) | ORAL | 0 refills | Status: DC

## 2019-03-29 NOTE — Progress Notes (Signed)
   Subjective:    Patient ID: Nicole Larson, female    DOB: 09-29-1975, 44 y.o.   MRN: [id number]  HPI  Virtual Visit via Video Note  I connected with Nicole Larson on 03/29/19 at  8:20 AM EST by a video enabled telemedicine application and verified that I am speaking with the correct person using two identifiers.  Location: Patient: home Provider: home   I discussed the limitations of evaluation and management by telemedicine and the availability of in person appointments. The patient expressed understanding and agreed to proceed.  History of Present Illness:  Pt states she has anxiety associated with work. She states around November anxiety started. 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 Pt has not gotten covid vaccine. She clarifies does not work with covid patients all the time.   Pt states usually in morning on the way to work will get panicky.  Pt is not depressed.  LMP- 3-4 weeks ago.     Observations/Objective: General-no acute distress, pleasant, oriented. Lungs- on inspection lungs appear unlabored. Neck- no tracheal deviation or jvd on inspection. Neuro- gross motor function appears intact.  Assessment and Plan: For anxiety rx of buspar. Counseled on me use. Start with low dose and may need increase of dose on follow up. Possible addition of ssri in future. Explained to patient will buspar better option to try compared to benzodiazepene.  Follow up in about 10 days as already scheduled with pcp.  Follow Up Instructions:    I discussed the assessment and treatment plan with the patient. The patient was provided an opportunity to ask questions and all were answered. The patient agreed with the plan and demonstrated an understanding of the instructions.   The patient was advised to call back or seek an in-person evaluation if the symptoms worsen or if the condition fails to improve as anticipated.  I provided 20 minutes of  non-face-to-face time during this encounter.   Mackie Pai, PA-C    Review of Systems  Constitutional: Negative for chills, fatigue and fever.  Respiratory: Negative for chest tightness, shortness of breath and wheezing.   Cardiovascular: Negative for chest pain and palpitations.  Neurological: Negative for dizziness, seizures, weakness and light-headedness.  Hematological: Negative for adenopathy. Does not bruise/bleed easily.  Psychiatric/Behavioral: Negative for behavioral problems, dysphoric mood, self-injury and sleep disturbance. The patient is nervous/anxious.        Objective:   Physical Exam        Assessment & Plan:

## 2019-03-29 NOTE — Patient Instructions (Signed)
For anxiety rx of buspar. Counseled on me use. Start with low dose and may need increase of dose on follow up. Possible addition of ssri in future. Explained to patient will buspar better option to try compared to benzodiazepene.  Follow up in about 10 days as already scheduled with pcp.

## 2019-04-10 ENCOUNTER — Encounter: Payer: Self-pay | Admitting: Family

## 2019-04-10 ENCOUNTER — Ambulatory Visit (INDEPENDENT_AMBULATORY_CARE_PROVIDER_SITE_OTHER): Payer: 59 | Admitting: Family

## 2019-04-10 ENCOUNTER — Other Ambulatory Visit: Payer: Self-pay

## 2019-04-10 VITALS — BP 155/95 | HR 94 | Temp 97.6°F | Resp 16 | Ht 62.5 in | Wt 167.0 lb

## 2019-04-10 DIAGNOSIS — R739 Hyperglycemia, unspecified: Secondary | ICD-10-CM

## 2019-04-10 DIAGNOSIS — F419 Anxiety disorder, unspecified: Secondary | ICD-10-CM | POA: Diagnosis not present

## 2019-04-10 DIAGNOSIS — I10 Essential (primary) hypertension: Secondary | ICD-10-CM | POA: Diagnosis not present

## 2019-04-10 LAB — BASIC METABOLIC PANEL
BUN: 15 mg/dL (ref 6–23)
CO2: 30 mEq/L (ref 19–32)
Calcium: 9.5 mg/dL (ref 8.4–10.5)
Chloride: 100 mEq/L (ref 96–112)
Creatinine, Ser: 1.15 mg/dL (ref 0.40–1.20)
GFR: 62.86 mL/min (ref >60.00)
Glucose, Bld: 93 mg/dL (ref 70–99)
Potassium: 3.9 mEq/L (ref 3.5–5.1)
Sodium: 137 mEq/L (ref 135–145)

## 2019-04-10 LAB — HEMOGLOBIN A1C: Hgb A1c MFr Bld: 5.1 % (ref 4.6–6.5)

## 2019-04-10 MED ORDER — AMLODIPINE BESYLATE 10 MG PO TABS: 10.0000 mg | ORAL_TABLET | Freq: Every day | ORAL | 1 refills | Status: DC

## 2019-04-10 MED ORDER — ESCITALOPRAM OXALATE 10 MG PO TABS: ORAL_TABLET | ORAL | 0 refills | Status: DC

## 2019-04-10 NOTE — Patient Instructions (Signed)
Stop buspar, start lexapro. Restart amlodipine.
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
## 2019-04-11 ENCOUNTER — Telehealth: Payer: Self-pay | Admitting: Family

## 2019-04-11 NOTE — Telephone Encounter (Signed)
Caller Name: Mitzel Phone: [phone]  Pt took escitalopram (LEXAPRO) 10 MG tablet yesterday about 2pm. She had to work at [place]. She said she laid down and when she woke up to get ready she had dry mouth, very tired, nauseous/like going to vomit, and headache. Pt does not want to continue this medication. Please advise.

## 2019-04-12 MED ORDER — DULOXETINE HCL 30 MG PO CPEP: ORAL_CAPSULE | ORAL | 0 refills | Status: DC

## 2019-04-12 NOTE — Telephone Encounter (Signed)
OK, stop lexapro, start trial of cymbalta instead if she is willing.

## 2019-04-12 NOTE — Telephone Encounter (Signed)
Patient advised of medication change. She will pick up new medication and call with any side effects

## 2019-04-15 ENCOUNTER — Encounter: Payer: Self-pay | Admitting: Family

## 2019-04-29 ENCOUNTER — Other Ambulatory Visit: Payer: Self-pay

## 2019-04-30 ENCOUNTER — Ambulatory Visit: Payer: 59 | Admitting: Family

## 2019-05-08 ENCOUNTER — Ambulatory Visit: Payer: 59 | Admitting: Family

## 2019-05-08 ENCOUNTER — Encounter: Payer: Self-pay | Admitting: Family

## 2019-05-08 ENCOUNTER — Ambulatory Visit (INDEPENDENT_AMBULATORY_CARE_PROVIDER_SITE_OTHER): Payer: 59 | Admitting: Family

## 2019-05-08 ENCOUNTER — Other Ambulatory Visit: Payer: Self-pay

## 2019-05-08 VITALS — BP 141/97 | HR 102 | Temp 97.4°F | Resp 16 | Ht 62.5 in | Wt 159.0 lb

## 2019-05-08 DIAGNOSIS — I10 Essential (primary) hypertension: Secondary | ICD-10-CM | POA: Diagnosis not present

## 2019-05-08 DIAGNOSIS — F32A Depression, unspecified: Secondary | ICD-10-CM | POA: Insufficient documentation

## 2019-05-08 DIAGNOSIS — F419 Anxiety disorder, unspecified: Secondary | ICD-10-CM | POA: Diagnosis not present

## 2019-05-08 DIAGNOSIS — Z0000 Encounter for general adult medical examination without abnormal findings: Secondary | ICD-10-CM

## 2019-05-08 MED ORDER — ALBUTEROL SULFATE HFA 108 (90 BASE) MCG/ACT IN AERS: 2.0000 | INHALATION_SPRAY | Freq: Four times a day (QID) | RESPIRATORY_TRACT | 5 refills | Status: DC | PRN

## 2019-05-08 MED ORDER — DULOXETINE HCL 30 MG PO CPEP: 60.0000 mg | ORAL_CAPSULE | Freq: Every day | ORAL | 1 refills | Status: DC

## 2019-05-08 MED ORDER — LISINOPRIL 10 MG PO TABS: 10.0000 mg | ORAL_TABLET | Freq: Every day | ORAL | 0 refills | Status: DC

## 2019-05-08 MED FILL — ALBUTEROL SULFATE HFA 108 (: 108 (90 BAS | 25 days supply | Qty: 18 | Fill #0

## 2019-05-08 MED FILL — LISINOPRIL 10 MG TABS: 10 | 30 days supply | Qty: 30 | Fill #0

## 2019-05-08 MED FILL — DULoxetine HCL 30 MG CPEP: 30 | 90 days supply | Qty: 180 | Fill #0

## 2019-05-08 NOTE — Patient Instructions (Signed)
Please add lisinopril once daily for blood pressure. 

## 2019-05-08 NOTE — Progress Notes (Signed)
Subjective:    Patient ID: Nicole Larson, female    DOB: February 26, 1976, 44 y.o.   MRN: [id number]  HPI   Patient is a 44 yr old female who presents today for routine follow up.  HTN-  Maintained on amlodipine 10mg .  BP Readings from Last 3 Encounters:  05/08/19 (!) 141/97  04/10/19 (!) 155/95  01/10/18 (!) 146/92   Hyperglycemia-  Lab Results  Component Value Date   HGBA1C 5.1 04/10/2019   Anxiety- reports that she took the highway the other day and "was fine."  Reports that she is feeling well on cymbalta. No obvious side effects. She did not tolerate the lexapro that we initially tried.      Review of Systems    see HPI  Past Medical History:  Diagnosis Date  . Hypertension      Social History   Socioeconomic History  . Marital status: Married    Spouse name: Not on file  . Number of children: Not on file  . Years of education: Not on file  . Highest education level: Not on file  Occupational History  . Not on file  Tobacco Use  . Smoking status: Never Smoker  . Smokeless tobacco: Never Used  Substance and Sexual Activity  . Alcohol use: Yes    Comment: occasionally throughout year  . Drug use: No  . Sexual activity: Not Currently    Birth control/protection: Surgical  Other Topics Concern  . 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 Difficulty of Paying Living Expenses: Not on file  Food Insecurity:   . Worried About Charity fundraiser in the Last Year: Not on file  . Ran Out of Food in the Last Year: Not on file  Transportation Needs:   . Lack of Transportation (Medical): Not on file  . Lack of  Transportation (Non-Medical): Not on file  Physical Activity:   . Days of Exercise per Week: Not on file  . Minutes of Exercise per Session: Not on file  Stress:   . Feeling of Stress : Not on file  Social Connections:   . Frequency of Communication with Friends and Family: Not on file  . Frequency of Social Gatherings with Friends and Family: Not on file  . Attends Religious Services: Not on file  . Active Member of Clubs or Organizations: Not on file  . Attends Archivist Meetings: Not on file  . Marital Status: Not on file  Intimate Partner Violence:   . Fear of Current or Ex-Partner: Not on file  . Emotionally Abused: Not on file  . Physically Abused: Not on file  . Sexually Abused: Not on file    Past Surgical History:  Procedure Laterality Date  . TUBAL LIGATION      Family History  Problem Relation Age of Onset  . Sickle cell anemia Father   . Diabetes Maternal Grandmother   . Hypertension Maternal Grandmother   . Diabetes Maternal Grandfather   . Hypertension Maternal Grandfather   . Diabetes Paternal Grandmother   .  Diabetes Paternal Grandfather     No Known Allergies  Current Outpatient Medications on File Prior to Visit  Medication Sig Dispense Refill  . amLODipine (NORVASC) 10 MG tablet Take 1 tablet (10 mg total) by mouth daily. 90 tablet 1  . Biotin 1000 MCG tablet Take 1,000 mcg by mouth daily.    . Multiple Vitamin (MULTIVITAMIN) tablet Take 1 tablet by mouth daily.    Marland Kitchen OVER THE COUNTER MEDICATION BLACKSEED OIL.  Take 2 teaspoons a day with honey.     No current facility-administered medications on file prior to visit.    BP (!) 141/97 (BP Location: Right Arm, Patient Position: Sitting, Cuff Size: Small)   Pulse (!) 102   Temp (!) 97.4 F (36.3 C) (Temporal)   Resp 16   Ht 5' 2.5" (1.588 m)   Wt 159 lb (72.1 kg)   SpO2 100%   BMI 28.62 kg/m     Objective:   Physical Exam Constitutional:      Appearance: She is well-developed.    Neck:     Thyroid: No thyromegaly.  Cardiovascular:     Rate and Rhythm: Normal rate and regular rhythm.     Heart sounds: Normal heart sounds. No murmur.  Pulmonary:     Effort: Pulmonary effort is normal. No respiratory distress.     Breath sounds: Normal breath sounds. No wheezing.  Musculoskeletal:     Cervical back: Neck supple.  Skin:    General: Skin is warm and dry.  Neurological:     Mental Status: She is alert and oriented to person, place, and time.  Psychiatric:        Behavior: Behavior normal.        Thought Content: Thought content normal.        Judgment: Judgment normal.           Assessment & Plan:  HTN- uncontrolled. Continue amlodipine, add lisinopril  Anxiety- improved on cymbalta. Continue same.   This visit occurred during the SARS-CoV-2 public health emergency.  Safety protocols were in place, including screening questions prior to the visit, additional usage of staff PPE, and extensive cleaning of exam room while observing appropriate contact time as indicated for disinfecting solutions.

## 2019-05-22 ENCOUNTER — Other Ambulatory Visit: Payer: Self-pay

## 2019-05-22 ENCOUNTER — Ambulatory Visit (INDEPENDENT_AMBULATORY_CARE_PROVIDER_SITE_OTHER): Payer: 59 | Admitting: Family

## 2019-05-22 VITALS — BP 130/78 | HR 96 | Temp 98.4°F | Resp 16 | Wt 161.8 lb

## 2019-05-22 DIAGNOSIS — K047 Periapical abscess without sinus: Secondary | ICD-10-CM | POA: Diagnosis not present

## 2019-05-22 MED ORDER — AMOXICILLIN-POT CLAVULANATE 875-125 MG PO TABS: 1.0000 | ORAL_TABLET | Freq: Two times a day (BID) | ORAL | 0 refills | Status: DC

## 2019-05-22 MED ORDER — HYDROCODONE-ACETAMINOPHEN 5-300 MG PO TABS: 1.0000 | ORAL_TABLET | Freq: Four times a day (QID) | ORAL | 0 refills | Status: DC | PRN

## 2019-05-22 MED FILL — AMOX-CLAV 875-125 MG TABLET: 875-125 | 10 days supply | Qty: 20 | Fill #0

## 2019-05-22 MED FILL — HYDROCODONE-ACETAMINOPHEN 5: 5-300 | 5 days supply | Qty: 20 | Fill #0

## 2019-05-22 NOTE — Progress Notes (Signed)
Subjective:    Patient ID: Nicole Larson, female    DOB: 15-Dec-1975, 44 y.o.   MRN: [id number]  HPI  Patient is a 44 yr old female who presents today with chief complaint of facial swelling on the right side of her face. She reports associated dental pain in the right upper mouth. Symptoms started yesterday.    Review of Systems See HPI  Past Medical History:  Diagnosis Date  . Hypertension      Social History   Socioeconomic History  . Marital status: Married    Spouse name: Not on file  . Number of children: Not on file  . Years of education: Not on file  . Highest education level: Not on file  Occupational History  . Not on file  Tobacco Use  . Smoking status: Never Smoker  . Smokeless tobacco: Never Used  Substance and Sexual Activity  . Alcohol use: Yes    Comment: occasionally throughout year  . Drug use: No  . Sexual activity: Not Currently    Birth control/protection: Surgical  Other Topics Concern  . 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 Difficulty of Paying Living Expenses:   Food Insecurity:   . Worried About Charity fundraiser in the Last Year:   . Arboriculturist in the Last Year:   Transportation Needs:   . Film/video editor (Medical):   Marland Kitchen Lack of Transportation (Non-Medical):   Physical Activity:   . Days of Exercise per Week:   . Minutes of Exercise per Session:   Stress:   . Feeling of Stress :   Social Connections:   . Frequency of Communication with Friends and Family:   . Frequency of Social Gatherings with Friends and Family:   . Attends Religious Services:   . Active Member of  Clubs or Organizations:   . Attends Archivist Meetings:   Marland Kitchen Marital Status:   Intimate Partner Violence:   . Fear of Current or Ex-Partner:   . Emotionally Abused:   Marland Kitchen Physically Abused:   . Sexually Abused:     Past Surgical History:  Procedure Laterality Date  . TUBAL LIGATION      Family History  Problem Relation Age of Onset  . Sickle cell anemia Father   . Diabetes Maternal Grandmother   . Hypertension Maternal Grandmother   . Diabetes Maternal Grandfather   . Hypertension Maternal Grandfather   . Diabetes Paternal Grandmother   . Diabetes Paternal Grandfather     No Known Allergies  Current Outpatient Medications on File Prior to Visit  Medication Sig Dispense Refill  . albuterol (VENTOLIN HFA) 108 (90 Base) MCG/ACT inhaler Inhale 2 puffs into the lungs every 6 (six) hours as needed for wheezing or shortness of breath. 6.7 g 5  . amLODipine (NORVASC) 10 MG tablet Take 1 tablet (10 mg total) by mouth daily. 90 tablet 1  . Biotin 1000 MCG tablet Take 1,000 mcg by mouth daily.    . DULoxetine (CYMBALTA) 30 MG capsule Take 2 capsules (  60 mg total) by mouth daily. 180 capsule 1  . lisinopril (ZESTRIL) 10 MG tablet Take 1 tablet (10 mg total) by mouth daily. 30 tablet 0  . Multiple Vitamin (MULTIVITAMIN) tablet Take 1 tablet by mouth daily.    Marland Kitchen OVER THE COUNTER MEDICATION BLACKSEED OIL.  Take 2 teaspoons a day with honey.     No current facility-administered medications on file prior to visit.    BP 130/78 (BP Location: Right Arm, Patient Position: Sitting, Cuff Size: Small)   Pulse 96   Temp 98.4 F (36.9 C) (Temporal)   Resp 16   Wt 161 lb 12.8 oz (73.4 kg)   SpO2 100%   BMI 29.12 kg/m       Objective:   Physical Exam Constitutional:      Appearance: She is well-developed.     Comments: Patient appears to be in extreme pain, unable to sit still in the chair  HENT:     Mouth/Throat:      Comments: Severe tenderness to palpation along gumline  above tooth #5 Neck:     Thyroid: No thyromegaly.  Cardiovascular:     Heart sounds: No murmur.  Musculoskeletal:     Cervical back: Neck supple.  Skin:    General: Skin is warm and dry.  Neurological:     Mental Status: She is alert and oriented to person, place, and time.  Psychiatric:        Behavior: Behavior normal.        Thought Content: Thought content normal.        Judgment: Judgment normal.           Assessment & Plan:  Dental abscess- pt in severe pain.  Advised pt as follows:  Please begin augmentin (antibiotic) for dental infection. You may use hydrocodone as needed for severe pain. Do not drive after taking this medication. (reviewed [place] controlled substance registry) Go to the ER if you develop fever >101, increasing pain, swelling. Schedule an appointment with a dentist as soon as possible.

## 2019-05-22 NOTE — Patient Instructions (Signed)
Please begin augmentin (antibiotic) for dental infection. You may use hydrocodone as needed for severe pain. Do not drive after taking this medication.  Go to the ER if you develop fever >101, increasing pain, swelling. Schedule an appointment with a dentist as soon as possible.

## 2019-05-23 ENCOUNTER — Encounter: Payer: Self-pay | Admitting: Family

## 2019-06-26 ENCOUNTER — Telehealth: Payer: Self-pay | Admitting: Family

## 2019-06-26 ENCOUNTER — Other Ambulatory Visit: Payer: Self-pay | Admitting: Family

## 2019-06-26 MED ORDER — LISINOPRIL 10 MG PO TABS: 10.0000 mg | ORAL_TABLET | Freq: Every day | ORAL | 2 refills | Status: DC

## 2019-06-26 MED FILL — LISINOPRIL 10 MG TABS: 10 | 30 days supply | Qty: 30 | Fill #0

## 2019-06-26 NOTE — Telephone Encounter (Signed)
Patient states pharmacy has no record of medication being sent in. Please advise   Medication: lisinopril (ZESTRIL) 10 MG tablet    Has the patient contacted their pharmacy? No. (If no, request that the patient contact the pharmacy for the refill.) (If yes, when and what did the pharmacy advise?)  Preferred Pharmacy (with phone number or street name):  [facility], [address]  [place], [place] [address]  Phone:  [phone] Fax:  [phone]  DEA #:  -- Agent: Please be advised that RX refills may take up to 3 business days. We ask that you follow-up with your pharmacy.

## 2019-06-26 NOTE — Telephone Encounter (Signed)
Spoke to [employer] at Pharmacy. She states they just filled RX for pt and Nicole Larson has told pt that RX is ready to pick up.

## 2019-06-26 NOTE — Telephone Encounter (Signed)
Patient had concerns about taking Lisinopril and possible kidney damage.  Patient advised per [facility] we are more concerned on controlling her blood pressure that long term side effects. We are also checking her BMP and will continue to monitor as long as she is on this medication.

## 2019-06-26 NOTE — Telephone Encounter (Signed)
Pt is calling because she has questions about her blood pressure med. lisinopril (ZESTRIL) 10 MG tablet .Marland Kitchen She requested for Rod Holler to call her back.

## 2019-06-27 ENCOUNTER — Other Ambulatory Visit: Payer: 59

## 2019-06-27 ENCOUNTER — Other Ambulatory Visit: Payer: Self-pay

## 2019-06-27 ENCOUNTER — Other Ambulatory Visit (INDEPENDENT_AMBULATORY_CARE_PROVIDER_SITE_OTHER): Payer: 59

## 2019-06-27 DIAGNOSIS — I10 Essential (primary) hypertension: Secondary | ICD-10-CM | POA: Diagnosis not present

## 2019-06-28 LAB — BASIC METABOLIC PANEL
BUN: 13 mg/dL (ref 6–23)
CO2: 30 mEq/L (ref 19–32)
Calcium: 9.3 mg/dL (ref 8.4–10.5)
Chloride: 104 mEq/L (ref 96–112)
Creatinine, Ser: 1.09 mg/dL (ref 0.40–1.20)
GFR: 66.8 mL/min (ref >60.00)
Glucose, Bld: 77 mg/dL (ref 70–99)
Potassium: 3.8 mEq/L (ref 3.5–5.1)
Sodium: 142 mEq/L (ref 135–145)

## 2019-08-09 ENCOUNTER — Other Ambulatory Visit (HOSPITAL_BASED_OUTPATIENT_CLINIC_OR_DEPARTMENT_OTHER): Payer: Self-pay | Admitting: Family

## 2019-08-09 ENCOUNTER — Ambulatory Visit (HOSPITAL_BASED_OUTPATIENT_CLINIC_OR_DEPARTMENT_OTHER)
Admission: RE | Admit: 2019-08-09 | Discharge: 2019-08-09 | Disposition: A | Discharge status: Home / Self Care | Payer: 59 | Source: Ambulatory Visit | Attending: Family | Admitting: Family

## 2019-08-09 ENCOUNTER — Other Ambulatory Visit: Payer: Self-pay

## 2019-08-09 DIAGNOSIS — Z1231 Encounter for screening mammogram for malignant neoplasm of breast: Secondary | ICD-10-CM

## 2019-08-12 ENCOUNTER — Telehealth: Payer: Self-pay | Admitting: Family

## 2019-08-12 ENCOUNTER — Emergency Department (HOSPITAL_BASED_OUTPATIENT_CLINIC_OR_DEPARTMENT_OTHER)
Admission: EM | Admit: 2019-08-12 | Discharge: 2019-08-12 | Disposition: A | Discharge status: Home / Self Care | Payer: 59 | Attending: Emergency Medicine | Admitting: Emergency Medicine

## 2019-08-12 ENCOUNTER — Ambulatory Visit (INDEPENDENT_AMBULATORY_CARE_PROVIDER_SITE_OTHER): Payer: 59 | Admitting: Family

## 2019-08-12 ENCOUNTER — Emergency Department (HOSPITAL_BASED_OUTPATIENT_CLINIC_OR_DEPARTMENT_OTHER): Payer: 59

## 2019-08-12 ENCOUNTER — Other Ambulatory Visit: Payer: Self-pay

## 2019-08-12 ENCOUNTER — Encounter (HOSPITAL_BASED_OUTPATIENT_CLINIC_OR_DEPARTMENT_OTHER): Payer: Self-pay

## 2019-08-12 ENCOUNTER — Encounter: Payer: Self-pay | Admitting: Family

## 2019-08-12 VITALS — BP 122/82 | HR 74 | Temp 96.9°F | Resp 16 | Ht 62.5 in | Wt 164.0 lb

## 2019-08-12 DIAGNOSIS — N739 Female pelvic inflammatory disease, unspecified: Secondary | ICD-10-CM | POA: Insufficient documentation

## 2019-08-12 DIAGNOSIS — I10 Essential (primary) hypertension: Secondary | ICD-10-CM | POA: Diagnosis not present

## 2019-08-12 DIAGNOSIS — D259 Leiomyoma of uterus, unspecified: Secondary | ICD-10-CM | POA: Diagnosis not present

## 2019-08-12 DIAGNOSIS — N730 Acute parametritis and pelvic cellulitis: Secondary | ICD-10-CM

## 2019-08-12 DIAGNOSIS — Z79899 Other long term (current) drug therapy: Secondary | ICD-10-CM | POA: Diagnosis not present

## 2019-08-12 DIAGNOSIS — A599 Trichomoniasis, unspecified: Secondary | ICD-10-CM | POA: Diagnosis not present

## 2019-08-12 DIAGNOSIS — R102 Pelvic and perineal pain: Secondary | ICD-10-CM

## 2019-08-12 LAB — BASIC METABOLIC PANEL
Anion gap: 7 (ref 5–15)
BUN: 15 mg/dL (ref 6–20)
CO2: 28 mmol/L (ref 22–32)
Calcium: 9.4 mg/dL (ref 8.9–10.3)
Chloride: 103 mmol/L (ref 98–111)
Creatinine, Ser: 0.97 mg/dL (ref 0.44–1.00)
GFR calc Af Amer: >60 mL/min (ref >60)
GFR calc non Af Amer: >60 mL/min (ref >60)
Glucose, Bld: 96 mg/dL (ref 70–99)
Potassium: 3.7 mmol/L (ref 3.5–5.1)
Sodium: 138 mmol/L (ref 135–145)

## 2019-08-12 LAB — WET PREP, GENITAL
Sperm: NONE SEEN
Yeast Wet Prep HPF POC: NONE SEEN

## 2019-08-12 LAB — CBC WITH DIFFERENTIAL/PLATELET
Abs Immature Granulocytes: 0.02 10*3/uL (ref 0.00–0.07)
Basophils Absolute: 0 10*3/uL (ref 0.0–0.1)
Basophils Relative: 0 %
Eosinophils Absolute: 0.2 10*3/uL (ref 0.0–0.5)
Eosinophils Relative: 3 %
HCT: 35.3 % — ABNORMAL LOW (ref 36.0–46.0)
Hemoglobin: 12.5 g/dL (ref 12.0–15.0)
Immature Granulocytes: 0 %
Lymphocytes Relative: 43 %
Lymphs Abs: 2.9 10*3/uL (ref 0.7–4.0)
MCH: 28.1 pg (ref 26.0–34.0)
MCHC: 35.4 g/dL (ref 30.0–36.0)
MCV: 79.3 fL — ABNORMAL LOW (ref 80.0–100.0)
Monocytes Absolute: 0.4 10*3/uL (ref 0.1–1.0)
Monocytes Relative: 6 %
Neutro Abs: 3.2 10*3/uL (ref 1.7–7.7)
Neutrophils Relative %: 48 %
Platelets: 299 10*3/uL (ref 150–400)
RBC: 4.45 MIL/uL (ref 3.87–5.11)
RDW: 13.1 % (ref 11.5–15.5)
WBC: 6.7 10*3/uL (ref 4.0–10.5)
nRBC: 0 % (ref 0.0–0.2)

## 2019-08-12 LAB — POC URINALSYSI DIPSTICK (AUTOMATED)
Bilirubin, UA: NEGATIVE
Glucose, UA: NEGATIVE
Ketones, UA: NEGATIVE
Leukocytes, UA: NEGATIVE
Nitrite, UA: NEGATIVE
Protein, UA: POSITIVE — AB
Spec Grav, UA: >=1.03 — AB (ref 1.010–1.025)
Urobilinogen, UA: NEGATIVE E.U./dL — AB
pH, UA: 6 (ref 5.0–8.0)

## 2019-08-12 LAB — URINALYSIS, ROUTINE W REFLEX MICROSCOPIC
Bilirubin Urine: NEGATIVE
Glucose, UA: NEGATIVE mg/dL
Ketones, ur: NEGATIVE mg/dL
Leukocytes,Ua: NEGATIVE
Nitrite: NEGATIVE
Protein, ur: NEGATIVE mg/dL
Specific Gravity, Urine: 1.01 (ref 1.005–1.030)
pH: 7 (ref 5.0–8.0)

## 2019-08-12 LAB — URINALYSIS, MICROSCOPIC (REFLEX)

## 2019-08-12 LAB — POCT URINE PREGNANCY: Preg Test, Ur: NEGATIVE

## 2019-08-12 MED ORDER — MORPHINE SULFATE (PF) 4 MG/ML IV SOLN
4.0000 mg | Freq: Once | INTRAVENOUS | Status: AC
  Administered 2019-08-12: 4 mg via INTRAVENOUS
  Filled 2019-08-12: qty 1

## 2019-08-12 MED ORDER — HYDROCODONE-ACETAMINOPHEN 5-325 MG PO TABS: 1.0000 | ORAL_TABLET | Freq: Four times a day (QID) | ORAL | 0 refills | Status: DC | PRN

## 2019-08-12 MED ORDER — METRONIDAZOLE 500 MG PO TABS
500.0000 mg | ORAL_TABLET | Freq: Two times a day (BID) | ORAL | 0 refills | Status: AC
Start: 2019-08-12 — End: 2019-08-26

## 2019-08-12 MED ORDER — METRONIDAZOLE 500 MG PO TABS: 500.0000 mg | ORAL_TABLET | Freq: Two times a day (BID) | ORAL | 0 refills | Status: DC

## 2019-08-12 MED ORDER — SODIUM CHLORIDE 0.9 % IV BOLUS
1000.0000 mL | Freq: Once | INTRAVENOUS | Status: AC
  Administered 2019-08-12: 1000 mL via INTRAVENOUS

## 2019-08-12 MED ORDER — IOHEXOL 300 MG/ML  SOLN
100.0000 mL | Freq: Once | INTRAMUSCULAR | Status: AC | PRN
  Administered 2019-08-12: 100 mL via INTRAVENOUS

## 2019-08-12 MED ORDER — CEFTRIAXONE SODIUM 500 MG IJ SOLR
500.0000 mg | Freq: Once | INTRAMUSCULAR | Status: AC
  Administered 2019-08-12: 500 mg via INTRAMUSCULAR
  Filled 2019-08-12: qty 500

## 2019-08-12 MED ORDER — IBUPROFEN 800 MG PO TABS
800.0000 mg | ORAL_TABLET | Freq: Three times a day (TID) | ORAL | 0 refills | Status: DC
Start: 2019-08-12 — End: 2019-12-20

## 2019-08-12 MED ORDER — METRONIDAZOLE 500 MG PO TABS
500.0000 mg | ORAL_TABLET | Freq: Once | ORAL | Status: AC
  Administered 2019-08-12: 500 mg via ORAL
  Filled 2019-08-12: qty 1

## 2019-08-12 MED ORDER — ONDANSETRON HCL 4 MG/2ML IJ SOLN
4.0000 mg | Freq: Once | INTRAMUSCULAR | Status: AC
  Administered 2019-08-12: 4 mg via INTRAVENOUS
  Filled 2019-08-12: qty 2

## 2019-08-12 MED ORDER — DOXYCYCLINE HYCLATE 100 MG PO TABS
100.0000 mg | ORAL_TABLET | Freq: Once | ORAL | Status: AC
  Administered 2019-08-12: 100 mg via ORAL
  Filled 2019-08-12: qty 1

## 2019-08-12 MED ORDER — DOXYCYCLINE HYCLATE 100 MG PO CAPS
100.0000 mg | ORAL_CAPSULE | Freq: Two times a day (BID) | ORAL | 0 refills | Status: AC
Start: 2019-08-12 — End: 2019-08-26

## 2019-08-12 MED FILL — METRONIDAZOLE 500 MG TABS: 500 | 14 days supply | Qty: 28 | Fill #0

## 2019-08-12 MED FILL — IBUPROFEN 800 MG TAB: 800 | 7 days supply | Qty: 21 | Fill #0

## 2019-08-12 MED FILL — HYDROCODON-APAP 5-325: 5-325 | 2 days supply | Qty: 8 | Fill #0

## 2019-08-12 MED FILL — DOXYCYCLINE HYCLATE 100 MG: 100 | 14 days supply | Qty: 28 | Fill #0

## 2019-08-12 NOTE — Progress Notes (Signed)
Subjective:    Patient ID: Nicole Larson, female    DOB: Sep 18, 1975, 44 y.o.   MRN: [id number]  HPI   Patient is a 44 yr old female who presents today with left sided lower abdominal pain. Tried a pain med last night to help with sleep. Also used heating pad last night.  Period started on 6/8-6/12. Yesterday had cramping on the left. Denies abnormal discharge.  Left foot- notes prominent bump on the top of her left foot.    Review of Systems See HPI  Past Medical History:  Diagnosis Date  . Hypertension      Social History   Socioeconomic History  . Marital status: Divorced    Spouse name: Not on file  . Number of children: Not on file  . Years of education: Not on file  . Highest education level: Not on file  Occupational History  . Not on file  Tobacco Use  . Smoking status: Never Smoker  . Smokeless tobacco: Never Used  Vaping Use  . Vaping Use: Never used  Substance and Sexual Activity  . Alcohol use: Yes    Comment: occasionally throughout year  . Drug use: No  . Sexual activity: Not Currently    Birth control/protection: Surgical  Other Topics Concern  . 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 Difficulty of Paying Living Expenses:   Food Insecurity:   . Worried About Charity fundraiser in the Last Year:   . Arboriculturist in the Last Year:   Transportation Needs:   . Film/video editor (Medical):   Marland Kitchen Lack of Transportation (Non-Medical):   Physical Activity:   . Days of Exercise per Week:   . Minutes of Exercise per Session:   Stress:   . Feeling of Stress :   Social Connections:   . Frequency  of Communication with Friends and Family:   . Frequency of Social Gatherings with Friends and Family:   . Attends Religious Services:   . Active Member of Clubs or Organizations:   . Attends Archivist Meetings:   Marland Kitchen Marital Status:   Intimate Partner Violence:   . Fear of Current or Ex-Partner:   . Emotionally Abused:   Marland Kitchen Physically Abused:   . Sexually Abused:     Past Surgical History:  Procedure Laterality Date  . TUBAL LIGATION      Family History  Problem Relation Age of Onset  . Sickle cell anemia Father   . Diabetes Maternal Grandmother   . Hypertension Maternal Grandmother   . Diabetes Maternal Grandfather   . Hypertension Maternal Grandfather   . Diabetes Paternal Grandmother   . Diabetes Paternal Grandfather     No Known Allergies  Current Outpatient Medications on File Prior to Visit  Medication Sig Dispense Refill  . albuterol (VENTOLIN HFA) 108 (90 Base) MCG/ACT inhaler Inhale 2 puffs into the lungs every 6 (six) hours as needed for wheezing or shortness of breath. 6.7 g 5  . amLODipine (NORVASC) 10 MG tablet Take 1 tablet (  10 mg total) by mouth daily. 90 tablet 1  . Biotin 1000 MCG tablet Take 1,000 mcg by mouth daily.    Marland Kitchen lisinopril (ZESTRIL) 10 MG tablet Take 1 tablet (10 mg total) by mouth daily. 30 tablet 2  . Multiple Vitamin (MULTIVITAMIN) tablet Take 1 tablet by mouth daily.    Marland Kitchen OVER THE COUNTER MEDICATION BLACKSEED OIL.  Take 2 teaspoons a day with honey.    . DULoxetine (CYMBALTA) 30 MG capsule Take 2 capsules (60 mg total) by mouth daily. (Patient not taking: Reported on 08/12/2019) 180 capsule 1   No current facility-administered medications on file prior to visit.    BP 122/82 (BP Location: Left Arm, Patient Position: Sitting, Cuff Size: Large)   Pulse 74   Temp (!) 96.9 F (36.1 C) (Temporal)   Resp 16   Ht 5' 2.5" (1.588 m)   Wt 164 lb (74.4 kg)   LMP 08/06/2019   SpO2 98%   BMI 29.52 kg/m       Objective:   Physical  Exam Constitutional:      Appearance: She is well-developed.  Cardiovascular:     Rate and Rhythm: Normal rate and regular rhythm.     Heart sounds: Normal heart sounds. No murmur heard.   Pulmonary:     Effort: Pulmonary effort is normal. No respiratory distress.     Breath sounds: Normal breath sounds. No wheezing.  Abdominal:     Palpations: Abdomen is soft.     Tenderness: There is no abdominal tenderness. There is no right CVA tenderness or left CVA tenderness.     Comments: Prominent non-tender bone noted left dorsal foot  Psychiatric:        Behavior: Behavior normal.        Thought Content: Thought content normal.        Judgment: Judgment normal.           Assessment & Plan:  Left sided pelvic pain-  New. Urine HCG is negative. UA notes trace protein, trace blood. Differential includes ovarian cyst (hemorrhagic cyst), kidney stone, ovarian torsion. Less likely diverticulitis.  Initially pt appeared moderately uncomfortable and I had planned to order a stat CT abd/pelvis, however her pain became more severe when she went to the lab and we decided to send her directly to the ER for further evaluation. I called the ER and gave report to the charge nurse (ER physician was in a room).  Pt wheeled down via wheelchair.   Foot problem- Reassurance that prominent bone on top of her foot is a normal variant.   This visit occurred during the SARS-CoV-2 public health emergency.  Safety protocols were in place, including screening questions prior to the visit, additional usage of staff PPE, and extensive cleaning of exam room while observing appropriate contact time as indicated for disinfecting solutions.

## 2019-08-12 NOTE — ED Provider Notes (Signed)
[facility] EMERGENCY DEPARTMENT Provider Note   CSN: [id number] Arrival date & time: 08/12/19  0846     History Chief Complaint  Patient presents with  . Pelvic Pain    Nicole Larson is a 44 y.o. female.  Nicole Larson is a 44 y.o. female with history of hypertension, who presents to the emergency department for left lower quadrant abdominal pain.  Patient states she had her usual menstrual cycle from June 8-12 with generalized lower abdominal cramping which is typical for her, but as per.  Ended on the 12th she began having focal left sided lower abdominal pain that has been constant and worsening since then.  She reports her menstrual cycle was not heavier than usual and vaginal bleeding has now stopped aside from some light spotting, she has not noted significant vaginal discharge.  Reports she has not been sexually active with any new partners recently.  Denies any dysuria, urinary frequency, hematuria or flank pain.  Denies associated chest pain or shortness of breath.  No nausea or vomiting.  No fevers or chills.  She is not taken anything for pain control arrival.  No other aggravating or alleviating factors.        Past Medical History:  Diagnosis Date  . Hypertension     Patient Active Problem List   Diagnosis Date Noted  . Anxiety 05/08/2019  . Overweight (BMI 25.0-29.9) 08/25/2016  . Hypertension 08/24/2016    Past Surgical History:  Procedure Laterality Date  . TUBAL LIGATION       OB History   No obstetric history on file.     Family History  Problem Relation Age of Onset  . Sickle cell anemia Father   . Diabetes Maternal Grandmother   . Hypertension Maternal Grandmother   . Diabetes Maternal Grandfather   . Hypertension Maternal Grandfather   . Diabetes Paternal Grandmother   . Diabetes Paternal Grandfather     Social History   Tobacco Use  . Smoking status: Never Smoker  . Smokeless tobacco: Never Used  Vaping Use  . Vaping  Use: Never used  Substance Use Topics  . Alcohol use: Yes    Comment: occasionally throughout year  . Drug use: No    Home Medications Prior to Admission medications   Medication Sig Start Date End Date Taking? Authorizing Provider  albuterol (VENTOLIN HFA) 108 (90 Base) MCG/ACT inhaler Inhale 2 puffs into the lungs every 6 (six) hours as needed for wheezing or shortness of breath. 05/08/19   [name], NP  amLODipine (NORVASC) 10 MG tablet Take 1 tablet (10 mg total) by mouth daily. 04/10/19   [name], NP  Biotin 1000 MCG tablet Take 1,000 mcg by mouth daily.    [provider]  DULoxetine (CYMBALTA) 30 MG capsule Take 2 capsules (60 mg total) by mouth daily. Patient not taking: Reported on 08/12/2019 05/08/19   [name], NP  lisinopril (ZESTRIL) 10 MG tablet Take 1 tablet (10 mg total) by mouth daily. 06/26/19   [name], NP  Multiple Vitamin (MULTIVITAMIN) tablet Take 1 tablet by mouth daily.    [provider]  OVER THE COUNTER MEDICATION BLACKSEED OIL.  Take 2 teaspoons a day with honey.    [provider]    Allergies    Patient has no known allergies.  Review of Systems   Review of Systems  Constitutional: Negative for chills and fever.  HENT: Negative.   Respiratory: Negative for cough and shortness of breath.  Cardiovascular: Negative for chest pain.  Gastrointestinal: Positive for abdominal pain and nausea. Negative for blood in stool, constipation, diarrhea and vomiting.  Genitourinary: Positive for pelvic pain. Negative for dysuria, flank pain, frequency, genital sores, vaginal bleeding and vaginal discharge.  Musculoskeletal: Negative for arthralgias and myalgias.  Skin: Negative for color change and rash.  Neurological: Negative for dizziness, syncope and light-headedness.  All other systems reviewed and are negative.   Physical Exam Updated Vital Signs BP (!) 133/99 (BP Location: Right Arm)    Pulse (!) 103   Temp 98.8 F (37.1 C) (Oral)   Resp 16   Ht 5\' 2"  (1.575 m)   Wt 74.4 kg   LMP 08/06/2019   SpO2 97%   BMI 30.00 kg/m   Physical Exam Vitals and nursing note reviewed. Exam conducted with a chaperone present.  Constitutional:      Appearance: Normal appearance. She is well-developed and normal weight. She is not toxic-appearing or diaphoretic.     Comments: Patient is alert, appears very uncomfortable, but nontoxic.  HENT:     Head: Normocephalic and atraumatic.  Eyes:     General:        Right eye: No discharge.        Left eye: No discharge.  Cardiovascular:     Rate and Rhythm: Normal rate and regular rhythm.     Heart sounds: Normal heart sounds. No friction rub. No gallop.   Pulmonary:     Effort: Pulmonary effort is normal. No respiratory distress.     Breath sounds: Normal breath sounds. No wheezing or rales.     Comments: Respirations equal and unlabored, patient able to speak in full sentences, lungs clear to auscultation bilaterally Abdominal:     General: Bowel sounds are normal. There is no distension.     Palpations: Abdomen is soft. There is no mass.     Tenderness: There is abdominal tenderness. There is no guarding.     Comments: Abdomen soft, nondistended, bowel sounds present throughout, patient is focally tender in the left lower quadrant and left pelvis, all other quadrants nontender, she does not have guarding or peritoneal signs, no CVA tenderness  Genitourinary:    Comments: Chaperone present during pelvic exam. No external genital lesions noted. On speculum exam patient has a moderate amount of white discharge with a small amount of blood noted, mild erythema of the cervical os On bimanual exam patient has some discomfort throughout the entire exam, with some adnexal tenderness noted on the left without masses or fullness Musculoskeletal:        General: No deformity.     Cervical back: Neck supple.  Skin:    General: Skin is warm and  dry.     Capillary Refill: Capillary refill takes less than 2 seconds.  Neurological:     Mental Status: She is alert.     Coordination: Coordination normal.     Comments: Speech is clear, able to follow commands Moves extremities without ataxia, coordination intact  Psychiatric:        Mood and Affect: Mood normal.        Behavior: Behavior normal.     ED Results / Procedures / Treatments   Labs (all labs ordered are listed, but only abnormal results are displayed) Labs Reviewed  WET PREP, GENITAL - Abnormal; Notable for the following components:      Result Value   Trich, Wet Prep PRESENT (*)    Clue Cells Wet Prep HPF POC  PRESENT (*)    WBC, Wet Prep HPF POC MANY (*)    All other components within normal limits  CBC WITH DIFFERENTIAL/PLATELET - Abnormal; Notable for the following components:   HCT 35.3 (*)    MCV 79.3 (*)    All other components within normal limits  URINALYSIS, ROUTINE W REFLEX MICROSCOPIC - Abnormal; Notable for the following components:   Color, Urine STRAW (*)    Hgb urine dipstick TRACE (*)    All other components within normal limits  URINALYSIS, MICROSCOPIC (REFLEX) - Abnormal; Notable for the following components:   Bacteria, UA RARE (*)    All other components within normal limits  BASIC METABOLIC PANEL  GC/CHLAMYDIA PROBE AMP ([place]) NOT AT [facility]    EKG None  Radiology CT ABDOMEN PELVIS W CONTRAST  Result Date: 08/12/2019 CLINICAL DATA:  LEFT lower quadrant pain.   Negative pregnancy test EXAM: CT ABDOMEN AND PELVIS WITH CONTRAST TECHNIQUE: Multidetector CT imaging of the abdomen and pelvis was performed using the standard protocol following bolus administration of intravenous contrast. CONTRAST:  130mL OMNIPAQUE IOHEXOL 300 MG/ML  SOLN COMPARISON:  None. FINDINGS: Lower chest: Lung bases are clear. Hepatobiliary: Low-attenuation lesion centrally within liver cannnot be characterized as benign cyst (Hounsfield units equal 23). Similar  lesion in the subcapsular RIGHT hepatic lobe. The central lesion measures 13 mm (22/2). This subcapsular lesion measures 10 mm. Gallbladder normal. Common bile duct normal Pancreas: Pancreas is normal. No ductal dilatation. No pancreatic inflammation. Spleen: Normal spleen Adrenals/urinary tract: Adrenal glands and kidneys are normal. The ureters and bladder normal. Stomach/Bowel: Stomach, small bowel, appendix, and cecum are normal. The colon and rectosigmoid colon are normal. Vascular/Lymphatic: Abdominal aorta is normal caliber. No periportal or retroperitoneal adenopathy. No pelvic adenopathy. Reproductive: Uterus and ovaries are normal. Small low-density lesions in the uterine body most consistent with small leiomyoma. No free fluid the pelvis. Other: No free fluid. Musculoskeletal: No aggressive osseous lesion. IMPRESSION: 1. No explanation for LEFT lower quadrant pain. No diverticulitis. No hernia. 2. Several low-density lesions in liver cannot be fully characterized. Favor benign cysts. Consider MRI with and without contrast characterization. This recommendation follows ACR consensus guidelines: Management of Incidental Liver Lesions on CT: A White Paper of the ACR Incidental Findings Committee. J Am Coll Radiol 2017; 32:9924-2683. Electronically Signed   By: Suzy Bouchard M.D.   On: 08/12/2019 12:56   US PELVIC COMPLETE W TRANSVAGINAL AND TORSION R/O  Result Date: 08/12/2019 CLINICAL DATA:  Left lower quadrant pain. EXAM: TRANSABDOMINAL AND TRANSVAGINAL ULTRASOUND OF PELVIS DOPPLER ULTRASOUND OF OVARIES TECHNIQUE: Both transabdominal and transvaginal ultrasound examinations of the pelvis were performed. Transabdominal technique was performed for global imaging of the pelvis including uterus, ovaries, adnexal regions, and pelvic cul-de-sac. It was necessary to proceed with endovaginal exam following the transabdominal exam to visualize the endometrium. Color and duplex Doppler ultrasound was utilized  to evaluate blood flow to the ovaries. COMPARISON:  None. FINDINGS: Uterus Measurements: 9.4 x 5.4 x 6.3 cm = volume: 167 mL. There are multiple small intramural uterine fibroids measuring up to 1.8 cm. Endometrium Thickness: 4 mm.  No focal abnormality visualized. Right ovary Measurements: 2.5 x 2.0 x 2.1 cm = volume: 4 mL. Normal appearance/no adnexal mass. Left ovary Measurements: 3.0 x 1.5 x 1.7 cm = volume: 5 mL. Normal appearance/no adnexal mass. Pulsed Doppler evaluation of both ovaries demonstrates normal low-resistance arterial and venous waveforms. Other findings No abnormal free fluid. IMPRESSION: 1. No acute abnormality. 2. Small intramural  uterine fibroids. Electronically Signed   By: Titus Dubin M.D.   On: 08/12/2019 11:51     Procedures Procedures (including critical care time)  Medications Ordered in ED Medications  ondansetron (ZOFRAN) injection 4 mg (4 mg Intravenous Given 08/12/19 1017)  sodium chloride 0.9 % bolus 1,000 mL (0 mLs Intravenous Stopped 08/12/19 1210)  morphine 4 MG/ML injection 4 mg (4 mg Intravenous Given 08/12/19 1019)  iohexol (OMNIPAQUE) 300 MG/ML solution 100 mL (100 mLs Intravenous Contrast Given 08/12/19 1223)  morphine 4 MG/ML injection 4 mg (4 mg Intravenous Given 08/12/19 1215)  cefTRIAXone (ROCEPHIN) injection 500 mg (500 mg Intramuscular Given 08/12/19 1353)  doxycycline (VIBRA-TABS) tablet 100 mg (100 mg Oral Given 08/12/19 1352)  metroNIDAZOLE (FLAGYL) tablet 500 mg (500 mg Oral Given 08/12/19 1351)    ED Course  I have reviewed the triage vital signs and the nursing notes.  Pertinent labs & imaging results that were available during my care of the patient were reviewed by me and considered in my medical decision making (see chart for details).    MDM Rules/Calculators/A&P                         Patient presents with severe left lower quadrant abdominal pain.  This started right after her menstrual cycle ended and has been constant and worsening  over the past 2 days.  On arrival patient appears very uncomfortable.  She is focally tender in the left lower quadrant and pelvic region, given that this started as her menstrual cycle finished I have high suspicion for pelvic etiology.  On exam she did have some discharge and cervical erythema left adnexal tenderness.  Wet prep and STD cultures pending, will get labs and pelvic ultrasound.  Pain medication given.  I have independently ordered, reviewed and interpreted all labs and imaging: CBC: no leukocytosis, normal Hgb BMP: No electrolyte derangements, normal renal and liver function UA: Without signs of infection Pregnancy: Negative Wet prep: Trichomoniasis present with many WBCs and clue cells present, GC and Chlamydia pending  Pelvic ultrasound with no acute abnormality, small intramural uterine fibroids noted.  On reevaluation patient still quite uncomfortable with continued pain in the left lower quadrant.  Given reassuring pelvic ultrasound, with no evidence of TOA, ovarian cyst or torsion.  Will get CT to rule out diverticulitis, colitis, or other potential intra-abdominal cause for patient's pain.  CT abdomen pelvis is unremarkable.  Images are reviewed by myself and agree with pathologist findings.  Given reassuring imaging and continued pain with trichomoniasis and WBCs noted on wet prep will treat for presumed PID, patient started on antibiotics here in the ED and stressed the importance of completing entire course, pain and nausea medicine given as well.  Will have patient follow-up with OB/GYN.  At this time she is stable for discharge home.   Final Clinical Impression(s) / ED Diagnoses Final diagnoses:  Pelvic pain  PID (acute pelvic inflammatory disease)  Trichomonal infection  Uterine leiomyoma, unspecified location    Rx / DC Orders ED Discharge Orders         Ordered    metroNIDAZOLE (FLAGYL) 500 MG tablet  2 times daily,   Status:  Discontinued     Reprint      08/12/19 1413    doxycycline (VIBRAMYCIN) 100 MG capsule  2 times daily     Discontinue  Reprint     08/12/19 1413    ibuprofen (ADVIL) 800 MG tablet  3 times daily     Discontinue  Reprint     08/12/19 1413    HYDROcodone-acetaminophen (NORCO) 5-325 MG tablet  Every 6 hours PRN     Discontinue  Reprint     08/12/19 1413    metroNIDAZOLE (FLAGYL) 500 MG tablet  2 times daily     Discontinue  Reprint     08/12/19 1417           Jacqlyn Larsen, PA-C 08/15/19 1359    Virgel Manifold, MD 08/16/19 1156

## 2019-08-12 NOTE — Telephone Encounter (Signed)
Patient states the gyn clinic called her , and she asked them to call her back , patient states they never called her back . Her referral  is closed . Patient would like a active referral. Please advise

## 2019-08-12 NOTE — ED Triage Notes (Signed)
Pt arrives to ED after seeing PCP today for LLQ pain. Pt reports she had her period June 8th- June 11th and continued to having sharp and cramping pain to LLQ.

## 2019-08-12 NOTE — ED Notes (Signed)
ED Provider at bedside. 

## 2019-08-12 NOTE — Discharge Instructions (Signed)
Your ultrasound showed some uterine fibroids, but no other findings and your CT looked good. Suspect your pain may be related to pelvic inflammatory disease which is an infection of your pelvic organs, likely related to the trichomonas that we saw on your wet prep but you also have gonorrhea and chlamydia test pending. Any sexual partners will need to be tested and treated. You should not be sexually active until you have completed entire course of both antibiotics which will need to be taken twice daily for 2 weeks. Please make sure you take medications with food on your stomach. Use ibuprofen and Tylenol for pain, Norco as needed for breakthrough pain.

## 2019-08-13 LAB — GC/CHLAMYDIA PROBE AMP (~~LOC~~) NOT AT ARMC
Chlamydia: NEGATIVE
Comment: NEGATIVE
Comment: NORMAL
Neisseria Gonorrhea: NEGATIVE

## 2019-08-14 ENCOUNTER — Other Ambulatory Visit: Payer: Self-pay

## 2019-08-14 DIAGNOSIS — Z01419 Encounter for gynecological examination (general) (routine) without abnormal findings: Secondary | ICD-10-CM

## 2019-08-14 NOTE — Telephone Encounter (Signed)
Referral entered  

## 2019-09-18 ENCOUNTER — Telehealth: Payer: Self-pay

## 2019-09-18 NOTE — Telephone Encounter (Signed)
Nurse Assessment Nurse: Fabio Neighbors, RN, Kitty Date/Time (Eastern Time): 09/17/2019 9:15:08 PM Confirm and document reason for call. If symptomatic, describe symptoms. ---Caller states that she is experiencing chest pain with the pain radiating down her left arm. Has the patient had close contact with a person known or suspected to have the novel coronavirus illness OR traveled / lives in area with major community spread (including international travel) in the last 14 days from the onset of symptoms? * If Asymptomatic, screen for exposure and travel within the last 14 days. ---No Does the patient have any new or worsening symptoms? ---Yes Will a triage be completed? ---Yes Related visit to physician within the last 2 weeks? ---No Does the PT have any chronic conditions? (i.e. diabetes, asthma, this includes High risk factors for pregnancy, etc.) ---Yes List chronic conditions. ---HTN, asthma, Is the patient pregnant or possibly pregnant? (Ask all females between the ages of 67-55) ---No Is this a behavioral health or substance abuse call? ---NoPLEASE NOTE: All timestamps contained within this report are represented as [place] Standard Time. CONFIDENTIALTY NOTICE: This fax transmission is intended only for the addressee. It contains information that is legally privileged, confidential or otherwise protected from use or disclosure. If you are not the intended recipient, you are strictly prohibited from reviewing, disclosing, copying using or disseminating any of this information or taking any action in reliance on or regarding this information. If you have received this fax in error, please notify us immediately by telephone so that we can arrange for its return to [place]. Phone: [phone], Toll-Free: [phone], Fax: [phone] Page: 2 of 2 Call Id: [id number] Guidelines Guideline Title Affirmed Question Affirmed Notes Nurse Date/Time Eilene Ghazi Time) Chest Pain [1] Chest pain lasts >  5 minutes AND [2] age > 4 AND [3] one or more cardiac risk factors (e.g., diabetes, high blood pressure, high cholesterol, smoker, or strong family history of heart disease) Fabio Neighbors, RN, Kitty 09/17/2019 9:16:45 PM Disp. Time Eilene Ghazi Time) Disposition Final User 09/17/2019 9:14:11 PM Send to Urgent Liana Crocker 09/17/2019 9:20:41 PM 911 Outcome Documentation Fabio Neighbors, RN, Perrin Smack Reason: refused EMS 09/17/2019 9:19:33 PM Call EMS 911 Now Yes Fabio Neighbors, RN, Perrin Smack Caller Disagree/Comply Comply Caller Understands Yes PreDisposition Call Doctor Care Advice Given Per Guideline CALL EMS 911 NOW: Comments User: Romie Jumper, RN Date/Time Eilene Ghazi Time): 09/17/2019 9:20:26 PM No ambulance called. 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 Advised to have someone take her to the ED in a w/c. Verbalized understanding. Referrals GO TO FACILITY OTHER - SPECIFY

## 2019-09-18 NOTE — Telephone Encounter (Signed)
Called patient and she reports she has not been evaluated at er, she decided not to go at that time because "she can loose her Job". I explained to her the importance of being evaluated.  She said she has to go to work again at 7 pm.  And "she will try to go to an urgent care at this time to be evaluated".

## 2019-10-02 ENCOUNTER — Encounter: Payer: Self-pay | Admitting: Obstetrics & Gynecology

## 2019-10-02 ENCOUNTER — Ambulatory Visit (INDEPENDENT_AMBULATORY_CARE_PROVIDER_SITE_OTHER): Payer: 59 | Admitting: Obstetrics & Gynecology

## 2019-10-02 ENCOUNTER — Encounter: Payer: 59 | Admitting: Obstetrics & Gynecology

## 2019-10-02 ENCOUNTER — Other Ambulatory Visit: Payer: Self-pay

## 2019-10-02 ENCOUNTER — Other Ambulatory Visit (HOSPITAL_COMMUNITY)
Admission: RE | Admit: 2019-10-02 | Discharge: 2019-10-02 | Disposition: A | Discharge status: Home / Self Care | Payer: 59 | Source: Ambulatory Visit | Attending: Obstetrics & Gynecology | Admitting: Obstetrics & Gynecology

## 2019-10-02 VITALS — BP 163/105 | HR 94 | Ht 62.0 in | Wt 165.0 lb

## 2019-10-02 DIAGNOSIS — N945 Secondary dysmenorrhea: Secondary | ICD-10-CM

## 2019-10-02 DIAGNOSIS — Z01419 Encounter for gynecological examination (general) (routine) without abnormal findings: Secondary | ICD-10-CM

## 2019-10-02 DIAGNOSIS — N6489 Other specified disorders of breast: Secondary | ICD-10-CM | POA: Diagnosis not present

## 2019-10-02 MED ORDER — DICLOFENAC POTASSIUM 50 MG PO TABS: 50.0000 mg | ORAL_TABLET | Freq: Three times a day (TID) | ORAL | 2 refills | Status: DC

## 2019-10-02 NOTE — Progress Notes (Signed)
Subjective:     Nicole Larson is a 44 y.o. female here for a routine exam. G4P4 Current complaints: Pt reports that one of her breast is larger than the other. She reports that she si not sure if this is getting worse but, wants it evaluated.   She reports pain with her cycles that is not controlled well with OTC Motrin.   Gynecologic History Patient's last menstrual period was 09/05/2019 (exact date). Contraception: tubal ligation Last Pap: unsure of date Last mammogram: 08/13/2019 Results were: normal  Obstetric History OB History  Gravida Para Term Preterm AB Living  4 4 4     4   SAB TAB Ectopic Multiple Live Births          4    # Outcome Date GA Lbr Len/2nd Weight Sex Delivery Anes PTL Lv  4 Term 2007 [redacted]w[redacted]d   M Vag-Spont EPI N LIV  3 Term 2004 [redacted]w[redacted]d   M Vag-Spont EPI N LIV  2 Term 2001 [redacted]w[redacted]d   M Vag-Spont None N LIV  1 Term 1998 [redacted]w[redacted]d   F Vag-Spont None N LIV   The following portions of the patient's history were reviewed and updated as appropriate: allergies, current medications, past family history, past medical history, past social history, past surgical history and problem list.  Review of Systems Pertinent items are noted in HPI.    Objective:  BP (!) 163/105   Pulse 94   Ht 5\' 2"  (1.575 m)   Wt 165 lb (74.8 kg)   LMP 09/05/2019 (Exact Date)   BMI 30.18 kg/m  General Appearance:    Alert, cooperative, no distress, appears stated age  Head:    Normocephalic, without obvious abnormality, atraumatic  Eyes:    conjunctiva/corneas clear, EOM's intact, both eyes  Ears:    Normal external ear canals, both ears  Nose:   Nares normal, septum midline, mucosa normal, no drainage    or sinus tenderness  Throat:   Lips, mucosa, and tongue normal; teeth and gums normal  Neck:   Supple, symmetrical, trachea midline, no adenopathy;    thyroid:  no enlargement/tenderness/nodules  Back:     Symmetric, no curvature, ROM normal, no CVA tenderness  Lungs:     respirations unlabored   Chest Wall:    No tenderness or deformity   Heart:    Regular rate and rhythm  Breast Exam:    No tenderness, masses, or nipple abnormality; slight asymmetry but within expected amount. Mammogram results.  reviewed.   Abdomen:     Soft, non-tender, bowel sounds active all four quadrants,    no masses, no organomegaly  Genitalia:    Normal female without lesion, discharge or tenderness     Extremities:   Extremities normal, atraumatic, no cyanosis or edema  Pulses:   2+ and symmetric all extremities  Skin:   Skin color, texture, turgor normal, no rashes or lesions    08/12/2019 CLINICAL DATA:  Left lower quadrant pain.  EXAM: TRANSABDOMINAL AND TRANSVAGINAL ULTRASOUND OF PELVIS  DOPPLER ULTRASOUND OF OVARIES  TECHNIQUE: Both transabdominal and transvaginal ultrasound examinations of the pelvis were performed. Transabdominal technique was performed for global imaging of the pelvis including uterus, ovaries, adnexal regions, and pelvic cul-de-sac.  It was necessary to proceed with endovaginal exam following the transabdominal exam to visualize the endometrium. Color and duplex Doppler ultrasound was utilized to evaluate blood flow to the ovaries.  COMPARISON:  None.  FINDINGS: Uterus  Measurements: 9.4 x 5.4 x 6.3 cm =  volume: 167 mL. There are multiple small intramural uterine fibroids measuring up to 1.8 cm.  Endometrium  Thickness: 4 mm.  No focal abnormality visualized.  Right ovary  Measurements: 2.5 x 2.0 x 2.1 cm = volume: 4 mL. Normal appearance/no adnexal mass.  Left ovary  Measurements: 3.0 x 1.5 x 1.7 cm = volume: 5 mL. Normal appearance/no adnexal mass.  Pulsed Doppler evaluation of both ovaries demonstrates normal low-resistance arterial and venous waveforms.  Other findings  No abnormal free fluid.  IMPRESSION: 1. No acute abnormality. 2. Small intramural uterine fibroids. Assessment:    Healthy female exam.   Uterine  fibroids- small Dysmenorrhea- on Motrin OTC. Breast asymmetry- this is WNL. Her mammogram was WNL.     Plan:   Lakina was seen today for gynecologic exam.  Diagnoses and all orders for this visit:  Well female exam with routine gynecological exam -     Cytology - PAP( [place])  Secondary dysmenorrhea -     diclofenac (CATAFLAM) 50 MG tablet; Take 1 tablet (50 mg total) by mouth 3 (three) times daily.  Breast asymmetry  f/u in 1 year or sooner prn   Nicole Larson, M.D., Nicole Larson

## 2019-10-07 LAB — CYTOLOGY - PAP
Chlamydia: NEGATIVE
Comment: NEGATIVE
Comment: NEGATIVE
Comment: NORMAL
Diagnosis: NEGATIVE
Diagnosis: REACTIVE
High risk HPV: NEGATIVE
Neisseria Gonorrhea: NEGATIVE

## 2019-11-05 ENCOUNTER — Telehealth (INDEPENDENT_AMBULATORY_CARE_PROVIDER_SITE_OTHER): Payer: 59 | Admitting: Family

## 2019-11-05 ENCOUNTER — Other Ambulatory Visit: Payer: Self-pay

## 2019-11-05 DIAGNOSIS — U071 COVID-19: Secondary | ICD-10-CM | POA: Diagnosis not present

## 2019-11-05 NOTE — Patient Instructions (Signed)
Please go to urgent care now.

## 2019-11-05 NOTE — Progress Notes (Signed)
Virtual Visit via Video Note  I connected with Nicole Larson on 11/05/19 at  4:00 PM EDT by a video enabled telemedicine application and verified that I am speaking with the correct person using two identifiers.  Location: Patient: home Provider: work   I discussed the limitations of evaluation and management by telemedicine and the availability of in person appointments. The patient expressed understanding and agreed to proceed. Only the patient and myself were present for today's video call.   History of Present Illness:  Patient is a 44 yr old female who presents today with report of positive covid-19 diagnosis.  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  On Sunday 10/27/19 she developed headache.  On Sunday she developed malaise.   Patient was diagnosed with covid on Friday. HA Sunday, malaise.  She sought covid testing on Monday 8/30 which was negative.  Tuesday she went to try to get her first covid vaccine but due to her symptoms she was unable to receive the vaccination.  Later on Tuesday she developed body aches, fever, chills.  She was retested on Friday 9/3 and tested positive for Covid.    She reports + cough, fever and shortness or breath. T Max 101.9 Saturday.She is drinking fluids but not eating solids as solids cause nausea. She has not lost her sense of smell but she has lost her sense of taste.    Past Medical History:  Diagnosis Date  . Hypertension      Social History   Socioeconomic History  . Marital status: Divorced    Spouse name: Not on file  . Number of children: Not on file  . Years of education: Not on file  . Highest education level: Not on file  Occupational History  . Not on file  Tobacco Use  . Smoking status: Never Smoker  . Smokeless tobacco: Never Used  Vaping Use  . Vaping Use: Never used  Substance and Sexual Activity  . Alcohol use: Yes    Comment: occasionally throughout year  . Drug use: No  . Sexual  activity: Not Currently    Birth control/protection: Surgical  Other Topics Concern  . Not on file  Social History Narrative   Works as a Quarry manager at [employer]   She will return to school at [employer] for nursing   4 children (1 daughter and 3 sons) Her 3 sons live at home   Has one granddaughter age 36   In process of divorce   Enjoys spending time with her children, shopping, netflix, going out, vacation   Social Determinants of Health   Financial Resource Strain:   . Difficulty of Paying Living Expenses: Not on file  Food Insecurity:   . Worried About Charity fundraiser in the Last Year: Not on file  . Ran Out of Food in the Last Year: Not on file  Transportation Needs:   . Lack of Transportation (Medical): Not on file  . Lack of Transportation (Non-Medical): Not on file  Physical Activity:   . Days of Exercise per Week: Not on file  . Minutes of Exercise per Session: Not on file  Stress:   . Feeling of Stress : Not on file  Social Connections:   . Frequency of Communication with Friends and Family: Not on file  . Frequency of Social Gatherings with Friends and Family: Not on file  . Attends Religious Services: Not on file  . Active Member of  Clubs or Organizations: Not on file  . Attends Archivist Meetings: Not on file  . Marital Status: Not on file  Intimate Partner Violence:   . Fear of Current or Ex-Partner: Not on file  . Emotionally Abused: Not on file  . Physically Abused: Not on file  . Sexually Abused: Not on file    Past Surgical History:  Procedure Laterality Date  . TUBAL LIGATION      Family History  Problem Relation Age of Onset  . Sickle cell anemia Father   . Diabetes Maternal Grandmother   . Hypertension Maternal Grandmother   . Diabetes Maternal Grandfather   . Hypertension Maternal Grandfather   . Diabetes Paternal Grandmother   . Diabetes Paternal Grandfather     No Known Allergies  Current Outpatient Medications on File  Prior to Visit  Medication Sig Dispense Refill  . albuterol (VENTOLIN HFA) 108 (90 Base) MCG/ACT inhaler Inhale 2 puffs into the lungs every 6 (six) hours as needed for wheezing or shortness of breath. 6.7 g 5  . amLODipine (NORVASC) 10 MG tablet Take 1 tablet (10 mg total) by mouth daily. 90 tablet 1  . Biotin 1000 MCG tablet Take 1,000 mcg by mouth daily.    . diclofenac (CATAFLAM) 50 MG tablet Take 1 tablet (50 mg total) by mouth 3 (three) times daily. 30 tablet 2  . DULoxetine (CYMBALTA) 30 MG capsule Take 2 capsules (60 mg total) by mouth daily. 180 capsule 1  . HYDROcodone-acetaminophen (NORCO) 5-325 MG tablet Take 1 tablet by mouth every 6 (six) hours as needed. 8 tablet 0  . ibuprofen (ADVIL) 800 MG tablet Take 1 tablet (800 mg total) by mouth 3 (three) times daily. 21 tablet 0  . lisinopril (ZESTRIL) 10 MG tablet Take 1 tablet (10 mg total) by mouth daily. 30 tablet 2  . Multiple Vitamin (MULTIVITAMIN) tablet Take 1 tablet by mouth daily.    Marland Kitchen OVER THE COUNTER MEDICATION BLACKSEED OIL.  Take 2 teaspoons a day with honey.     No current facility-administered medications on file prior to visit.    There were no vitals taken for this visit.    Observations/Objective:   Gen: Awake, alert, appears ill  Resp: Breathing is mildly labored, + conversational dyspnea is noted Psych: calm/pleasant demeanor Neuro: Alert and Oriented x 3, + facial symmetry   Assessment and Plan:  COVID-19 infection- she does not have a pulse oximeter at home. I am concerned about her SOB. I recommended ED, but she declines due to cost and does not wish to be admitted due to needing to care for her children. She is willing to be evaluated in an urgent care and she is advised to seek evaluation this evening.  She is also requesting a note for work. I have written her out of work and we will plan to check back on 9/13 for a virtual follow up visit.   Follow Up Instructions:    I discussed the assessment  and treatment plan with the patient. The patient was provided an opportunity to ask questions and all were answered. The patient agreed with the plan and demonstrated an understanding of the instructions.   The patient was advised to call back or seek an in-person evaluation if the symptoms worsen or if the condition fails to improve as anticipated.  Nance Pear, NP

## 2019-11-06 ENCOUNTER — Encounter (HOSPITAL_BASED_OUTPATIENT_CLINIC_OR_DEPARTMENT_OTHER): Payer: Self-pay | Admitting: *Deleted

## 2019-11-06 ENCOUNTER — Emergency Department (HOSPITAL_BASED_OUTPATIENT_CLINIC_OR_DEPARTMENT_OTHER): Payer: 59

## 2019-11-06 ENCOUNTER — Other Ambulatory Visit: Payer: Self-pay

## 2019-11-06 ENCOUNTER — Emergency Department (HOSPITAL_BASED_OUTPATIENT_CLINIC_OR_DEPARTMENT_OTHER)
Admission: EM | Admit: 2019-11-06 | Discharge: 2019-11-06 | Disposition: A | Discharge status: Home / Self Care | Payer: 59 | Attending: Emergency Medicine | Admitting: Emergency Medicine

## 2019-11-06 DIAGNOSIS — I10 Essential (primary) hypertension: Secondary | ICD-10-CM | POA: Diagnosis not present

## 2019-11-06 DIAGNOSIS — Z79899 Other long term (current) drug therapy: Secondary | ICD-10-CM | POA: Diagnosis not present

## 2019-11-06 DIAGNOSIS — U071 COVID-19: Secondary | ICD-10-CM | POA: Diagnosis not present

## 2019-11-06 DIAGNOSIS — J1282 Pneumonia due to coronavirus disease 2019: Secondary | ICD-10-CM | POA: Insufficient documentation

## 2019-11-06 DIAGNOSIS — R0602 Shortness of breath: Secondary | ICD-10-CM | POA: Diagnosis present

## 2019-11-06 LAB — CBC WITH DIFFERENTIAL/PLATELET
Abs Immature Granulocytes: 0 10*3/uL (ref 0.00–0.07)
Basophils Absolute: 0 10*3/uL (ref 0.0–0.1)
Basophils Relative: 0 %
Eosinophils Absolute: 0 10*3/uL (ref 0.0–0.5)
Eosinophils Relative: 0 %
HCT: 35.4 % — ABNORMAL LOW (ref 36.0–46.0)
Hemoglobin: 12.5 g/dL (ref 12.0–15.0)
Immature Granulocytes: 0 %
Lymphocytes Relative: 49 %
Lymphs Abs: 1.5 10*3/uL (ref 0.7–4.0)
MCH: 27.8 pg (ref 26.0–34.0)
MCHC: 35.3 g/dL (ref 30.0–36.0)
MCV: 78.7 fL — ABNORMAL LOW (ref 80.0–100.0)
Monocytes Absolute: 0.2 10*3/uL (ref 0.1–1.0)
Monocytes Relative: 5 %
Neutro Abs: 1.5 10*3/uL — ABNORMAL LOW (ref 1.7–7.7)
Neutrophils Relative %: 46 %
Platelets: 202 10*3/uL (ref 150–400)
RBC: 4.5 MIL/uL (ref 3.87–5.11)
RDW: 12.7 % (ref 11.5–15.5)
WBC: 3.2 10*3/uL — ABNORMAL LOW (ref 4.0–10.5)
nRBC: 0 % (ref 0.0–0.2)

## 2019-11-06 LAB — COMPREHENSIVE METABOLIC PANEL
ALT: 24 U/L (ref 0–44)
AST: 29 U/L (ref 15–41)
Albumin: 3.9 g/dL (ref 3.5–5.0)
Alkaline Phosphatase: 34 U/L — ABNORMAL LOW (ref 38–126)
Anion gap: 11 (ref 5–15)
BUN: 13 mg/dL (ref 6–20)
CO2: 25 mmol/L (ref 22–32)
Calcium: 8.3 mg/dL — ABNORMAL LOW (ref 8.9–10.3)
Chloride: 100 mmol/L (ref 98–111)
Creatinine, Ser: 1.01 mg/dL — ABNORMAL HIGH (ref 0.44–1.00)
GFR calc Af Amer: >60 mL/min (ref >60)
GFR calc non Af Amer: >60 mL/min (ref >60)
Glucose, Bld: 89 mg/dL (ref 70–99)
Potassium: 3.6 mmol/L (ref 3.5–5.1)
Sodium: 136 mmol/L (ref 135–145)
Total Bilirubin: 0.4 mg/dL (ref 0.3–1.2)
Total Protein: 7.6 g/dL (ref 6.5–8.1)

## 2019-11-06 MED ORDER — ACETAMINOPHEN 325 MG PO TABS
650.0000 mg | ORAL_TABLET | Freq: Once | ORAL | Status: AC | PRN
  Administered 2019-11-06: 650 mg via ORAL
  Filled 2019-11-06: qty 2

## 2019-11-06 MED ORDER — SODIUM CHLORIDE 0.9 % IV BOLUS
1000.0000 mL | Freq: Once | INTRAVENOUS | Status: AC
  Administered 2019-11-06: 1000 mL via INTRAVENOUS

## 2019-11-06 MED ORDER — ALBUTEROL SULFATE HFA 108 (90 BASE) MCG/ACT IN AERS
2.0000 | INHALATION_SPRAY | Freq: Once | RESPIRATORY_TRACT | Status: AC
  Administered 2019-11-06: 2 via RESPIRATORY_TRACT
  Filled 2019-11-06: qty 6.7

## 2019-11-06 NOTE — ED Notes (Signed)
ED Provider at bedside. 

## 2019-11-06 NOTE — Progress Notes (Signed)
RT walked to check on patient at check-in, due to triage being busy. Patient is SOB with exertion, but SAT 100% and BBS clear but slightly diminished. RT will monitor further as needed

## 2019-11-06 NOTE — ED Triage Notes (Signed)
Pt c/o SOB with exertion   Covid + x 9 days

## 2019-11-06 NOTE — Discharge Instructions (Addendum)
Stay hydrated   Use albuterol as needed for cough.   I ordered antibody infusion and the clinic should contact you   See your doctor   Stay home on 9/13   Return to ER if you have worse shortness of breath, fever, trouble breathing

## 2019-11-06 NOTE — ED Provider Notes (Signed)
[facility] Provider Note   CSN: [id number] Arrival date & time: 11/06/19  1534     History Chief Complaint  Patient presents with  . Shortness of Breath    Nicole Larson is a 44 y.o. female hx of HTN, here presenting with persistent shortness of breath and chills.  Patient tested positive for Covid on September 3.  She states that several days before she has been having cough and chills.  She states that she was told that she can go back to work today but has persistent chills and shortness of breath.  Patient did not receive the Covid vaccine.  The history is provided by the patient.       Past Medical History:  Diagnosis Date  . Hypertension     Patient Active Problem List   Diagnosis Date Noted  . Anxiety 05/08/2019  . Overweight (BMI 25.0-29.9) 08/25/2016  . Hypertension 08/24/2016    Past Surgical History:  Procedure Laterality Date  . TUBAL LIGATION       OB History    Gravida  4   Para  4   Term  4   Preterm      AB      Living  4     SAB      TAB      Ectopic      Multiple      Live Births  4           Family History  Problem Relation Age of Onset  . Sickle cell anemia Father   . Diabetes Maternal Grandmother   . Hypertension Maternal Grandmother   . Diabetes Maternal Grandfather   . Hypertension Maternal Grandfather   . Diabetes Paternal Grandmother   . Diabetes Paternal Grandfather     Social History   Tobacco Use  . Smoking status: Never Smoker  . Smokeless tobacco: Never Used  Vaping Use  . Vaping Use: Never used  Substance Use Topics  . Alcohol use: Yes    Comment: occasionally throughout year  . Drug use: No    Home Medications Prior to Admission medications   Medication Sig Start Date End Date Taking? Authorizing Provider  albuterol (VENTOLIN HFA) 108 (90 Base) MCG/ACT inhaler Inhale 2 puffs into the lungs every 6 (six) hours as needed for wheezing or shortness of breath.  05/08/19   [name], NP  amLODipine (NORVASC) 10 MG tablet Take 1 tablet (10 mg total) by mouth daily. 04/10/19   [name], NP  Biotin 1000 MCG tablet Take 1,000 mcg by mouth daily.    [provider]  diclofenac (CATAFLAM) 50 MG tablet Take 1 tablet (50 mg total) by mouth 3 (three) times daily. 10/02/19   Lavonia Drafts, MD  DULoxetine (CYMBALTA) 30 MG capsule Take 2 capsules (60 mg total) by mouth daily. 05/08/19   [name], NP  HYDROcodone-acetaminophen (NORCO) 5-325 MG tablet Take 1 tablet by mouth every 6 (six) hours as needed. 08/12/19   Jacqlyn Larsen, PA-C  ibuprofen (ADVIL) 800 MG tablet Take 1 tablet (800 mg total) by mouth 3 (three) times daily. 08/12/19   Jacqlyn Larsen, PA-C  lisinopril (ZESTRIL) 10 MG tablet Take 1 tablet (10 mg total) by mouth daily. 06/26/19   [name], NP  Multiple Vitamin (MULTIVITAMIN) tablet Take 1 tablet by mouth daily.    [provider]  OVER THE COUNTER MEDICATION BLACKSEED OIL.  Take 2 teaspoons a day with honey.  [provider]    Allergies    Patient has no known allergies.  Review of Systems   Review of Systems  Respiratory: Positive for cough and shortness of breath.   All other systems reviewed and are negative.   Physical Exam Updated Vital Signs BP (!) 150/97 (BP Location: Right Arm)   Pulse 93   Temp 100.3 F (37.9 C) (Oral)   Resp 19   Ht 5\' 2"  (1.575 m)   Wt 74.8 kg   LMP 10/23/2019   SpO2 100%   BMI 30.18 kg/m   Physical Exam Vitals and nursing note reviewed.  Constitutional:      Comments: Slightly dehydrated   HENT:     Head: Normocephalic.     Mouth/Throat:     Mouth: Mucous membranes are moist.  Eyes:     Pupils: Pupils are equal, round, and reactive to light.  Cardiovascular:     Rate and Rhythm: Normal rate and regular rhythm.  Pulmonary:     Comments: Diminished throughout, no obvious wheezing or crackles  Abdominal:     General:  Bowel sounds are normal.     Palpations: Abdomen is soft.  Musculoskeletal:        General: Normal range of motion.     Cervical back: Normal range of motion.  Skin:    General: Skin is warm.     Capillary Refill: Capillary refill takes less than 2 seconds.  Neurological:     General: No focal deficit present.     Mental Status: She is alert and oriented to person, place, and time.  Psychiatric:        Mood and Affect: Mood normal.        Behavior: Behavior normal.     ED Results / Procedures / Treatments   Labs (all labs ordered are listed, but only abnormal results are displayed) Labs Reviewed  CBC WITH DIFFERENTIAL/PLATELET - Abnormal; Notable for the following components:      Result Value   WBC 3.2 (*)    HCT 35.4 (*)    MCV 78.7 (*)    Neutro Abs 1.5 (*)    All other components within normal limits  COMPREHENSIVE METABOLIC PANEL - Abnormal; Notable for the following components:   Creatinine, Ser 1.01 (*)    Calcium 8.3 (*)    Alkaline Phosphatase 34 (*)    All other components within normal limits    EKG None  Radiology DG Chest Portable 1 View  Result Date: 11/06/2019 CLINICAL DATA:  COVID-19 pneumonia EXAM: PORTABLE CHEST 1 VIEW COMPARISON:  February 14, 2016 FINDINGS: There are hazy bilateral airspace opacities most evident in the left mid and lower lung zone. There is no pneumothorax. No large pleural effusion the heart size is normal. There is no acute displaced fracture. IMPRESSION: Hazy bilateral airspace opacities concerning for multifocal pneumonia (viral or bacterial). Electronically Signed   By: Constance Holster M.D.   On: 11/06/2019 16:55    Procedures Procedures (including critical care time)  Medications Ordered in ED Medications  acetaminophen (TYLENOL) tablet 650 mg (650 mg Oral Given 11/06/19 1831)  sodium chloride 0.9 % bolus 1,000 mL (1,000 mLs Intravenous New Bag/Given 11/06/19 1938)  albuterol (VENTOLIN HFA) 108 (90 Base) MCG/ACT inhaler 2  puff (2 puffs Inhalation Given 11/06/19 1941)    ED Course  I have reviewed the triage vital signs and the nursing notes.  Pertinent labs & imaging results that were available during my care of the patient  were reviewed by me and considered in my medical decision making (see chart for details).    MDM Rules/Calculators/A&P                         Nicole Larson is a 44 y.o. female here with cough, SOB. Tested positive for COVID on 9/3. Has worsening SOB. Not hypoxic in the ED. Has low grade temp as well. CXR showed pneumonia but WBC is slightly low. Given IVF and felt better. Ordered monoclonal antibody infusion outpatient. Told her to stay home on 9/13.    Final Clinical Impression(s) / ED Diagnoses Final diagnoses:  None    Rx / DC Orders ED Discharge Orders    None       Drenda Freeze, MD 11/06/19 2128

## 2019-11-06 NOTE — ED Notes (Signed)
RT walked to check on patient at check-in, due to triage being busy. Patient is SOB with exertion, but SAT 100% and BBS clear but slightly diminished. RT will monitor further as needed

## 2019-11-11 ENCOUNTER — Telehealth (INDEPENDENT_AMBULATORY_CARE_PROVIDER_SITE_OTHER): Payer: 59 | Admitting: Family

## 2019-11-11 ENCOUNTER — Encounter: Payer: Self-pay | Admitting: Family

## 2019-11-11 ENCOUNTER — Other Ambulatory Visit: Payer: Self-pay

## 2019-11-11 DIAGNOSIS — U071 COVID-19: Secondary | ICD-10-CM | POA: Diagnosis not present

## 2019-11-11 DIAGNOSIS — J1282 Pneumonia due to coronavirus disease 2019: Secondary | ICD-10-CM | POA: Diagnosis not present

## 2019-11-11 MED ORDER — AZITHROMYCIN 250 MG PO TABS: ORAL_TABLET | ORAL | 0 refills | Status: DC

## 2019-11-11 NOTE — Progress Notes (Signed)
Virtual Visit via Video Note  I connected with Nicole Larson on 11/11/19 at 12:40 PM EDT by a video enabled telemedicine application and verified that I am speaking with the correct person using two identifiers.  Location: Patient: home Provider: work   I discussed the limitations of evaluation and management by telemedicine and the availability of in person appointments. The patient expressed understanding and agreed to proceed. Only the patient and myself were present for today's video call.   History of Present Illness:  Patient is a 44 yr old female who presents for follow up today of her covid-19 infection. We last saw her on 11/05/2019.  She received a positive Covid-19 test on 11/01/19.  Due to progressive SOB we referred her to the ED.  She was evaluated in the ED on 11/06/19. She was not hypoxic in the ED. She was treated with IVF and ordered for monoclonal antibody infusion as an outpatient.  CXR noted multifocal pneumonia.   She reports that she is feeling better, but she is "just tired."  Feels like if she overdoes it because of SOB and fatigue.  + loss of taste.  Smell is intact she thinks.  She reports subjective fever the other day but none the last few days.  She was not treated with abx.    Last date of work was on the 28th. Symptoms began on 8/30.  Observations/Objective:   Gen: Awake, alert, no acute distress Resp: Breathing is even and non-labored Psych: calm/pleasant demeanor Neuro: Alert and Oriented x 3, + facial symmetry, speech is clear.   Assessment and Plan:  COVID-19 infection/pneumonia- she is showing clinical improvement. She does still have some SOB.  She states she was told by the ED that somebody would be reaching out to her to schedule monoclonal antibody infusion, but she states nobody has called her.  I checked with our monoclonal antibody provider team and they are only administering up to day 10 and it has been >10 days since the onset of her symptoms.   Will rx with azithromycin.  She is advised to remain out of work this week and will plan to come see me back in 1 week.  Hopefully she will be feeling better at that time and we can release her to return to work.  Follow Up Instructions:    I discussed the assessment and treatment plan with the patient. The patient was provided an opportunity to ask questions and all were answered. The patient agreed with the plan and demonstrated an understanding of the instructions.   The patient was advised to call back or seek an in-person evaluation if the symptoms worsen or if the condition fails to improve as anticipated.  Nance Pear, NP

## 2019-11-12 ENCOUNTER — Telehealth: Payer: Self-pay | Admitting: Family

## 2019-11-12 NOTE — Telephone Encounter (Signed)
Called patient, she is not sure of what is missing on form. She is waiting for a call from [employer] and she will call back with the information

## 2019-11-12 NOTE — Telephone Encounter (Signed)
Patient the leave of absence paperwork for [employer] was incomplete. She was very vague and I was unable to get any other details. Please call the patient to advise.

## 2019-11-13 NOTE — Telephone Encounter (Signed)
Per patient form was missing return to work date, date was entered by provider and form faxed back to [employer] with correction

## 2019-11-18 ENCOUNTER — Encounter: Payer: Self-pay | Admitting: Family

## 2019-11-18 ENCOUNTER — Telehealth (INDEPENDENT_AMBULATORY_CARE_PROVIDER_SITE_OTHER): Payer: 59 | Admitting: Family

## 2019-11-18 ENCOUNTER — Ambulatory Visit (HOSPITAL_COMMUNITY)
Admission: EM | Admit: 2019-11-18 | Discharge: 2019-11-18 | Disposition: A | Discharge status: Home / Self Care | Payer: 59

## 2019-11-18 ENCOUNTER — Telehealth: Payer: Self-pay | Admitting: Family

## 2019-11-18 ENCOUNTER — Other Ambulatory Visit: Payer: Self-pay

## 2019-11-18 DIAGNOSIS — U071 COVID-19: Secondary | ICD-10-CM | POA: Diagnosis not present

## 2019-11-18 NOTE — Telephone Encounter (Signed)
First available at the [facility] was Tuesday 11-26-19. Ok per [place] patient was advised to seek in person evaluation today at urgent care.

## 2019-11-18 NOTE — Telephone Encounter (Signed)
Can you please see if you can get her into the post-covid care clinic today or tomorrow?   [phone]

## 2019-11-18 NOTE — Progress Notes (Signed)
Virtual Visit via Video Note  I connected with Nicole Larson on 11/18/19 at 10:20 AM EDT by a video enabled telemedicine application and verified that I am speaking with the correct person using two identifiers.  Location: Patient: parked car Provider: work   I discussed the limitations of evaluation and management by telemedicine and the availability of in person appointments. The patient expressed understanding and agreed to proceed. Only the patient and myself were present for today's video call.   History of Present Illness:  Patient is a 44 yr old female who presents today for follow up of her covid-19 infection. She was diagnosed on 11/01/19.  Went to the ED on 11/06/19.  We last saw her on 11/11/19. At that appointment she noted overall improvement but ongoing fatigue.  She continued to have SOB with exertion and loss of taste.  I treated her with azithromycin.  Today she reports the following symptoms:  HA comes and goes.    Diarrhea last night  Fatigue if she pushes herself too hard.    Has a lot of shortness of breath with exertion.     Observations/Objective:   Gen: Awake, alert, no acute distress Resp: Breathing is even and non-labored Psych: calm/pleasant demeanor Neuro: Alert and Oriented x 3, + facial symmetry, speech is clear.   Assessment and Plan:  Covid-19- She is really not showing much clinical improvement. I would really like to have her evaluated for a face to face with vitals/chest x-ray.  We reached out to our post covid care clinic and their first available appointment is on 11/26/19. We scheduled her for 11/26/19 but in the meantime, since we do not have any face to face visit openings in our clinic today, we advised her to go to an urgent care for further evaluation.  15 minutes spent on today's visit.  Follow Up Instructions:    I discussed the assessment and treatment plan with the patient. The patient was provided an opportunity to ask questions  and all were answered. The patient agreed with the plan and demonstrated an understanding of the instructions.   The patient was advised to call back or seek an in-person evaluation if the symptoms worsen or if the condition fails to improve as anticipated.  Nance Pear, NP

## 2019-11-19 ENCOUNTER — Emergency Department (HOSPITAL_BASED_OUTPATIENT_CLINIC_OR_DEPARTMENT_OTHER): Payer: 59

## 2019-11-19 ENCOUNTER — Other Ambulatory Visit: Payer: Self-pay

## 2019-11-19 ENCOUNTER — Other Ambulatory Visit (HOSPITAL_BASED_OUTPATIENT_CLINIC_OR_DEPARTMENT_OTHER): Payer: Self-pay | Admitting: Family

## 2019-11-19 ENCOUNTER — Encounter (HOSPITAL_BASED_OUTPATIENT_CLINIC_OR_DEPARTMENT_OTHER): Payer: Self-pay | Admitting: *Deleted

## 2019-11-19 ENCOUNTER — Emergency Department (HOSPITAL_BASED_OUTPATIENT_CLINIC_OR_DEPARTMENT_OTHER)
Admission: EM | Admit: 2019-11-19 | Discharge: 2019-11-19 | Disposition: A | Discharge status: Home / Self Care | Payer: 59 | Attending: Emergency Medicine | Admitting: Emergency Medicine

## 2019-11-19 ENCOUNTER — Emergency Department (HOSPITAL_BASED_OUTPATIENT_CLINIC_OR_DEPARTMENT_OTHER): Admission: EM | Admit: 2019-11-19 | Discharge: 2019-11-19 | Discharge status: Absent without leave | Payer: 59

## 2019-11-19 DIAGNOSIS — U071 COVID-19: Secondary | ICD-10-CM | POA: Diagnosis not present

## 2019-11-19 DIAGNOSIS — Z79899 Other long term (current) drug therapy: Secondary | ICD-10-CM | POA: Insufficient documentation

## 2019-11-19 DIAGNOSIS — R0602 Shortness of breath: Secondary | ICD-10-CM | POA: Diagnosis present

## 2019-11-19 DIAGNOSIS — I10 Essential (primary) hypertension: Secondary | ICD-10-CM | POA: Diagnosis not present

## 2019-11-19 DIAGNOSIS — R030 Elevated blood-pressure reading, without diagnosis of hypertension: Secondary | ICD-10-CM

## 2019-11-19 LAB — CBC WITH DIFFERENTIAL/PLATELET
Abs Immature Granulocytes: 0.02 10*3/uL (ref 0.00–0.07)
Basophils Absolute: 0 10*3/uL (ref 0.0–0.1)
Basophils Relative: 0 %
Eosinophils Absolute: 0.1 10*3/uL (ref 0.0–0.5)
Eosinophils Relative: 1 %
HCT: 37.4 % (ref 36.0–46.0)
Hemoglobin: 12.8 g/dL (ref 12.0–15.0)
Immature Granulocytes: 0 %
Lymphocytes Relative: 35 %
Lymphs Abs: 2.6 10*3/uL (ref 0.7–4.0)
MCH: 27.5 pg (ref 26.0–34.0)
MCHC: 34.2 g/dL (ref 30.0–36.0)
MCV: 80.4 fL (ref 80.0–100.0)
Monocytes Absolute: 0.5 10*3/uL (ref 0.1–1.0)
Monocytes Relative: 6 %
Neutro Abs: 4.3 10*3/uL (ref 1.7–7.7)
Neutrophils Relative %: 58 %
Platelets: 345 10*3/uL (ref 150–400)
RBC: 4.65 MIL/uL (ref 3.87–5.11)
RDW: 13.2 % (ref 11.5–15.5)
WBC: 7.5 10*3/uL (ref 4.0–10.5)
nRBC: 0 % (ref 0.0–0.2)

## 2019-11-19 LAB — BASIC METABOLIC PANEL
Anion gap: 10 (ref 5–15)
BUN: 17 mg/dL (ref 6–20)
CO2: 26 mmol/L (ref 22–32)
Calcium: 9.2 mg/dL (ref 8.9–10.3)
Chloride: 103 mmol/L (ref 98–111)
Creatinine, Ser: 0.88 mg/dL (ref 0.44–1.00)
GFR calc Af Amer: >60 mL/min (ref >60)
GFR calc non Af Amer: >60 mL/min (ref >60)
Glucose, Bld: 89 mg/dL (ref 70–99)
Potassium: 3.4 mmol/L — ABNORMAL LOW (ref 3.5–5.1)
Sodium: 139 mmol/L (ref 135–145)

## 2019-11-19 MED ORDER — IOHEXOL 350 MG/ML SOLN
75.0000 mL | Freq: Once | INTRAVENOUS | Status: AC | PRN
  Administered 2019-11-19: 75 mL via INTRAVENOUS

## 2019-11-19 NOTE — Discharge Instructions (Addendum)
Your Ct was negative for any acute abnormalities. Please follow up with your primary care doctor.      Person Under Monitoring Name: Nicole Larson  Location: [address] [place] [address]   Infection Prevention Recommendations for Individuals Confirmed to have, or Being Evaluated for, 2019 Novel Coronavirus (COVID-19) Infection Who Receive Care at Home  Individuals who are confirmed to have, or are being evaluated for, COVID-19 should follow the prevention steps below until a healthcare provider or local or state health department says they can return to normal activities.  Stay home except to get medical care You should restrict activities outside your home, except for getting medical care. Do not go to work, school, or public areas, and do not use public transportation or taxis.  Call ahead before visiting your doctor Before your medical appointment, call the healthcare provider and tell them that you have, or are being evaluated for, COVID-19 infection. This will help the healthcare provider's office take steps to keep other people from getting infected. Ask your healthcare provider to call the local or state health department.  Monitor your symptoms Seek prompt medical attention if your illness is worsening (e.g., difficulty breathing). Before going to your medical appointment, call the healthcare provider and tell them that you have, or are being evaluated for, COVID-19 infection. Ask your healthcare provider to call the local or state health department.  Wear a facemask You should wear a facemask that covers your nose and mouth when you are in the same room with other people and when you visit a healthcare provider. People who live with or visit you should also wear a facemask while they are in the same room with you.  Separate yourself from other people in your home As much as possible, you should stay in a different room from other people in your home.  Also, you should use a separate bathroom, if available.  Avoid sharing household items You should not share dishes, drinking glasses, cups, eating utensils, towels, bedding, or other items with other people in your home. After using these items, you should wash them thoroughly with soap and water.  Cover your coughs and sneezes Cover your mouth and nose with a tissue when you cough or sneeze, or you can cough or sneeze into your sleeve. Throw used tissues in a lined trash can, and immediately wash your hands with soap and water for at least 20 seconds or use an alcohol-based hand rub.  Wash your [facility] your hands often and thoroughly with soap and water for at least 20 seconds. You can use an alcohol-based hand sanitizer if soap and water are not available and if your hands are not visibly dirty. Avoid touching your eyes, nose, and mouth with unwashed hands.   Prevention Steps for Caregivers and Household Members of Individuals Confirmed to have, or Being Evaluated for, COVID-19 Infection Being Cared for in the Home  If you live with, or provide care at home for, a person confirmed to have, or being evaluated for, COVID-19 infection please follow these guidelines to prevent infection:  Follow healthcare provider's instructions Make sure that you understand and can help the patient follow any healthcare provider instructions for all care.  Provide for the patient's basic needs You should help the patient with basic needs in the home and provide support for getting groceries, prescriptions, and other personal needs.  Monitor the patient's symptoms If they are getting sicker, call his or her medical provider and  tell them that the patient has, or is being evaluated for, COVID-19 infection. This will help the healthcare provider's office take steps to keep other people from getting infected. Ask the healthcare provider to call the local or state health department.  Limit the  number of people who have contact with the patient If possible, have only one caregiver for the patient. Other household members should stay in another home or place of residence. If this is not possible, they should stay in another room, or be separated from the patient as much as possible. Use a separate bathroom, if available. Restrict visitors who do not have an essential need to be in the home.  Keep older adults, very young children, and other sick people away from the patient Keep older adults, very young children, and those who have compromised immune systems or chronic health conditions away from the patient. This includes people with chronic heart, lung, or kidney conditions, diabetes, and cancer.  Ensure good ventilation Make sure that shared spaces in the home have good air flow, such as from an air conditioner or an opened window, weather permitting.  Wash your hands often Wash your hands often and thoroughly with soap and water for at least 20 seconds. You can use an alcohol based hand sanitizer if soap and water are not available and if your hands are not visibly dirty. Avoid touching your eyes, nose, and mouth with unwashed hands. Use disposable paper towels to dry your hands. If not available, use dedicated cloth towels and replace them when they become wet.  Wear a facemask and gloves Wear a disposable facemask at all times in the room and gloves when you touch or have contact with the patient's blood, body fluids, and/or secretions or excretions, such as sweat, saliva, sputum, nasal mucus, vomit, urine, or feces.  Ensure the mask fits over your nose and mouth tightly, and do not touch it during use. Throw out disposable facemasks and gloves after using them. Do not reuse. Wash your hands immediately after removing your facemask and gloves. If your personal clothing becomes contaminated, carefully remove clothing and launder. Wash your hands after handling contaminated  clothing. Place all used disposable facemasks, gloves, and other waste in a lined container before disposing them with other household waste. Remove gloves and wash your hands immediately after handling these items.  Do not share dishes, glasses, or other household items with the patient Avoid sharing household items. You should not share dishes, drinking glasses, cups, eating utensils, towels, bedding, or other items with a patient who is confirmed to have, or being evaluated for, COVID-19 infection. After the person uses these items, you should wash them thoroughly with soap and water.  Wash laundry thoroughly Immediately remove and wash clothes or bedding that have blood, body fluids, and/or secretions or excretions, such as sweat, saliva, sputum, nasal mucus, vomit, urine, or feces, on them. Wear gloves when handling laundry from the patient. Read and follow directions on labels of laundry or clothing items and detergent. In general, wash and dry with the warmest temperatures recommended on the label.  Clean all areas the individual has used often Clean all touchable surfaces, such as counters, tabletops, doorknobs, bathroom fixtures, toilets, phones, keyboards, tablets, and bedside tables, every day. Also, clean any surfaces that may have blood, body fluids, and/or secretions or excretions on them. Wear gloves when cleaning surfaces the patient has come in contact with. Use a diluted bleach solution (e.g., dilute bleach with 1 part bleach  and 10 parts water) or a household disinfectant with a label that says EPA-registered for coronaviruses. To make a bleach solution at home, add 1 tablespoon of bleach to 1 quart (4 cups) of water. For a larger supply, add  cup of bleach to 1 gallon (16 cups) of water. Read labels of cleaning products and follow recommendations provided on product labels. Labels contain instructions for safe and effective use of the cleaning product including precautions you  should take when applying the product, such as wearing gloves or eye protection and making sure you have good ventilation during use of the product. Remove gloves and wash hands immediately after cleaning.  Monitor yourself for signs and symptoms of illness Caregivers and household members are considered close contacts, should monitor their health, and will be asked to limit movement outside of the home to the extent possible. Follow the monitoring steps for close contacts listed on the symptom monitoring form.   ? If you have additional questions, contact your local health department or call the epidemiologist on call at [phone] (available 24/7). ? This guidance is subject to change. For the most up-to-date guidance from [facility], please refer to their website: [email or web address]

## 2019-11-19 NOTE — ED Provider Notes (Signed)
[facility] EMERGENCY DEPARTMENT Provider Note   CSN: [id number] Arrival date & time: 11/19/19  1210     History No chief complaint on file.   Nicole Larson is a 44 y.o. female presents emergency department for pleuritic chest pain and shortness of breath.  Patient was diagnosed with Covid and has been having symptoms since 28 August.  She has had of painful cough but recently developed left-sided rib sternal chest pain which is sharp and worse with deep breathing.  She has associated exertional dyspnea.  She sent to the emergency department for chest x-ray by her PCP.  She denies unilateral leg swelling, hemoptysis.  She is not on any birth control and does not smoke.  She has persistent cough  HPI     Past Medical History:  Diagnosis Date  . Hypertension     Patient Active Problem List   Diagnosis Date Noted  . Anxiety 05/08/2019  . Overweight (BMI 25.0-29.9) 08/25/2016  . Hypertension 08/24/2016    Past Surgical History:  Procedure Laterality Date  . TUBAL LIGATION       OB History    Gravida  4   Para  4   Term  4   Preterm      AB      Living  4     SAB      TAB      Ectopic      Multiple      Live Births  4           Family History  Problem Relation Age of Onset  . Sickle cell anemia Father   . Diabetes Maternal Grandmother   . Hypertension Maternal Grandmother   . Diabetes Maternal Grandfather   . Hypertension Maternal Grandfather   . Diabetes Paternal Grandmother   . Diabetes Paternal Grandfather     Social History   Tobacco Use  . Smoking status: Never Smoker  . Smokeless tobacco: Never Used  Vaping Use  . Vaping Use: Never used  Substance Use Topics  . Alcohol use: Yes    Comment: occasionally throughout year  . Drug use: No    Home Medications Prior to Admission medications   Medication Sig Start Date End Date Taking? Authorizing Provider  albuterol (VENTOLIN HFA) 108 (90 Base) MCG/ACT inhaler Inhale 2  puffs into the lungs every 6 (six) hours as needed for wheezing or shortness of breath. 05/08/19   [name], NP  amLODipine (NORVASC) 10 MG tablet Take 1 tablet (10 mg total) by mouth daily. 04/10/19   [name], NP  azithromycin (ZITHROMAX) 250 MG tablet Take 2 tabs by mouth today, then one tab once daily for 4 more days. 11/11/19   [name], NP  Biotin 1000 MCG tablet Take 1,000 mcg by mouth daily.    [provider]  diclofenac (CATAFLAM) 50 MG tablet Take 1 tablet (50 mg total) by mouth 3 (three) times daily. 10/02/19   Lavonia Drafts, MD  DULoxetine (CYMBALTA) 30 MG capsule Take 2 capsules (60 mg total) by mouth daily. 05/08/19   [name], NP  HYDROcodone-acetaminophen (NORCO) 5-325 MG tablet Take 1 tablet by mouth every 6 (six) hours as needed. 08/12/19   Jacqlyn Larsen, PA-C  ibuprofen (ADVIL) 800 MG tablet Take 1 tablet (800 mg total) by mouth 3 (three) times daily. 08/12/19   Jacqlyn Larsen, PA-C  lisinopril (ZESTRIL) 10 MG tablet Take 1 tablet (10 mg total) by mouth daily. 06/26/19  [name], NP  Multiple Vitamin (MULTIVITAMIN) tablet Take 1 tablet by mouth daily.    [provider]  OVER THE COUNTER MEDICATION BLACKSEED OIL.  Take 2 teaspoons a day with honey.    [provider]    Allergies    Patient has no known allergies.  Review of Systems   Review of Systems Ten systems reviewed and are negative for acute change, except as noted in the HPI.   Physical Exam Updated Vital Signs BP (!) 119/103 (BP Location: Right Arm)   Pulse 96   Temp 98.8 F (37.1 C) (Oral)   Resp 20   Ht 5\' 2"  (1.575 m)   Wt 74.8 kg   LMP 10/23/2019   SpO2 100%   BMI 30.16 kg/m   Physical Exam Vitals and nursing note reviewed.  Constitutional:      General: She is not in acute distress.    Appearance: She is well-developed. She is not diaphoretic.  HENT:     Head: Normocephalic and atraumatic.  Eyes:      General: No scleral icterus.    Conjunctiva/sclera: Conjunctivae normal.  Cardiovascular:     Rate and Rhythm: Normal rate and regular rhythm.     Heart sounds: Normal heart sounds. No murmur heard.  No friction rub. No gallop.   Pulmonary:     Effort: Pulmonary effort is normal. No respiratory distress.     Breath sounds: Normal breath sounds.  Abdominal:     General: Bowel sounds are normal. There is no distension.     Palpations: Abdomen is soft. There is no mass.     Tenderness: There is no abdominal tenderness. There is no guarding.  Musculoskeletal:     Cervical back: Normal range of motion.  Skin:    General: Skin is warm and dry.  Neurological:     Mental Status: She is alert and oriented to person, place, and time.  Psychiatric:        Behavior: Behavior normal.     ED Results / Procedures / Treatments   Labs (all labs ordered are listed, but only abnormal results are displayed) Labs Reviewed  BASIC METABOLIC PANEL - Abnormal; Notable for the following components:      Result Value   Potassium 3.4 (*)    All other components within normal limits  CBC WITH DIFFERENTIAL/PLATELET    EKG None  Radiology CT Angio Chest PE W and/or Wo Contrast  Result Date: 11/19/2019 CLINICAL DATA:  COVID pneumonia EXAM: CT ANGIOGRAPHY CHEST WITH CONTRAST TECHNIQUE: Multidetector CT imaging of the chest was performed using the standard protocol during bolus administration of intravenous contrast. Multiplanar CT image reconstructions and MIPs were obtained to evaluate the vascular anatomy. CONTRAST:  50mL OMNIPAQUE IOHEXOL 350 MG/ML SOLN COMPARISON:  None. FINDINGS: Cardiovascular: Contrast injection is sufficient to demonstrate satisfactory opacification of the pulmonary arteries to the segmental level. There is no pulmonary embolus or evidence of right heart strain. The size of the main pulmonary artery is normal. Heart size is normal, with no pericardial effusion. The course and caliber  of the aorta are normal. There is no atherosclerotic calcification. Opacification decreased due to pulmonary arterial phase contrast bolus timing. Mediastinum/Nodes: -- No mediastinal lymphadenopathy. -- No hilar lymphadenopathy. -- No axillary lymphadenopathy. -- No supraclavicular lymphadenopathy. --there is a 1.2 cm left-sided thyroid nodule. No followup recommended (ref: J Am Coll Radiol. 2015 Feb;12(2): 143-50). -  Unremarkable esophagus. Lungs/Pleura: Airways are patent. No pleural effusion, lobar consolidation, pneumothorax or  pulmonary infarction. Upper Abdomen: Contrast bolus timing is not optimized for evaluation of the abdominal organs. The visualized portions of the organs of the upper abdomen are normal. Musculoskeletal: No chest wall abnormality. No bony spinal canal stenosis. Review of the MIP images confirms the above findings. IMPRESSION: No evidence of pulmonary embolism or other acute intrathoracic process. Electronically Signed   By: Constance Holster M.D.   On: 11/19/2019 17:42   DG Chest Portable 1 View  Result Date: 11/19/2019 CLINICAL DATA:  COVID positive EXAM: PORTABLE CHEST 1 VIEW COMPARISON:  11/06/2019 chest radiograph and prior. FINDINGS: The heart size and mediastinal contours are within normal limits. Both lungs are clear. No pneumothorax or pleural effusion. No acute osseous abnormality. IMPRESSION: No focal airspace disease. Electronically Signed   By: Primitivo Gauze M.D.   On: 11/19/2019 12:58    Procedures Procedures (including critical care time)  Medications Ordered in ED Medications  iohexol (OMNIPAQUE) 350 MG/ML injection 75 mL (75 mLs Intravenous Contrast Given 11/19/19 1725)    ED Course  I have reviewed the triage vital signs and the nursing notes.  Pertinent labs & imaging results that were available during my care of the patient were reviewed by me and considered in my medical decision making (see chart for details).    MDM  Rules/Calculators/A&P                          [id number] cp and sob after covid 19 infection VS: BP (!) 119/103 (BP Location: Right Arm)   Pulse 96   Temp 98.8 F (37.1 C) (Oral)   Resp 20   Ht 5\' 2"  (1.575 m)   Wt 74.8 kg   LMP 10/23/2019   SpO2 100%   BMI 30.16 kg/m   [id number] is gathered by patient and EMR. Previous records obtained and reviewed. DDX:The patient's complaint of chest pain involves an extensive number of diagnostic and treatment options, and is a complaint that carries with it a high risk of complications, morbidity, and potential mortality. Given the large differential diagnosis, medical decision making is of high complexity. The emergent differential diagnosis of chest pain includes: Acute coronary syndrome, pericarditis, aortic dissection, pulmonary embolism, tension pneumothorax, pneumonia, and esophageal rupture.   Labs: I ordered reviewed and interpreted labs which include CBC without abnormality, BMP with potassium level just below normal of insignificant value Imaging: I ordered and reviewed images which included 1 view chest x-ray and CT angiogram pulmonary embolism study with contrast. I independently visualized and interpreted all imaging. There are no acute, significant findings on today's images. EKG: Consults: MDM: Patient here with pleuritic chest pain and exertional dyspnea after recent COVID-19 infection.  She has some residual cough.  Patient is moderately anxious about her symptoms.  I have no concern for ACS.  The patient is ambulatory in the ER has normal oxygen saturations is not overtly dyspneic and is well clear of her original Covid infection in terms of return to work or spread to others.  There is no evidence of pulmonary embolism, residual or secondary bacterial infection, cardiomegaly.  No physical exam findings concerning for CHF.  Think this is all related to residual effects of post Covid bronchitis and patient may be discharged safely to  follow closely with her primary care doctor if she needs any further work-up.  Discussed return precautions.  Patient appears appropriate for discharge and is safe to return to work. Patient disposition: Discharge The patient appears reasonably  screened and/or stabilized for discharge and I doubt any other medical condition or other [facility] requiring further screening, evaluation, or treatment in the ED at this time prior to discharge. I have discussed lab and/or imaging findings with the patient and answered all questions/concerns to the best of my ability.I have discussed return precautions and OP follow up.  Nicole Larson was evaluated in Emergency Department on 11/20/2019 for the symptoms described in the history of present illness. She was evaluated in the context of the global COVID-19 pandemic, which necessitated consideration that the patient might be at risk for infection with the SARS-CoV-2 virus that causes COVID-19. Institutional protocols and algorithms that pertain to the evaluation of patients at risk for COVID-19 are in a state of rapid change based on information released by regulatory bodies including the CDC and federal and state organizations. These policies and algorithms were followed during the patient's care in the ED.  Final Clinical Impression(s) / ED Diagnoses Final diagnoses:  [id number] virus infection  Elevated blood pressure reading    Rx / DC Orders ED Discharge Orders    None       Margarita Mail, PA-C 11/20/19 1110    Truddie Hidden, MD 11/20/19 1332

## 2019-11-19 NOTE — Telephone Encounter (Signed)
Patient states she is unable to xray at urgent care it would be up to the provider at the urgent whether she needs a xray or not.   Please Advise

## 2019-11-19 NOTE — ED Triage Notes (Signed)
She was Covid positive over 14 days ago. Here today for continued cough and a CXR.

## 2019-11-25 ENCOUNTER — Other Ambulatory Visit: Payer: Self-pay

## 2019-11-25 ENCOUNTER — Telehealth (INDEPENDENT_AMBULATORY_CARE_PROVIDER_SITE_OTHER): Payer: 59 | Admitting: Family

## 2019-11-25 ENCOUNTER — Encounter: Payer: Self-pay | Admitting: Family

## 2019-11-25 ENCOUNTER — Telehealth: Payer: Self-pay | Admitting: Family

## 2019-11-25 DIAGNOSIS — U071 COVID-19: Secondary | ICD-10-CM

## 2019-11-25 NOTE — Telephone Encounter (Signed)
Patient states if you could please update her return to work date in [employer]

## 2019-11-25 NOTE — Progress Notes (Signed)
Virtual Visit via Video Note  I connected with Nicole Larson on 11/25/19 at 12:40 PM EDT by a video enabled telemedicine application and verified that I am speaking with the correct person using two identifiers.  Location: Patient: home Provider: work   I discussed the limitations of evaluation and management by telemedicine and the availability of in person appointments. The patient expressed understanding and agreed to proceed. Only the patient and myself were present for today's video call.   The call was started as a video call, however due to poor audio on the video platform we transition to a telephone call.  History of Present Illness:  Patient is a 44 yr old female who presents today for follow up of her COVID-19 infection.  She reports that she has started to see some improvement. She reports that this past week she has been able to shower independently.  She has been able to begin some brief chores at home such as laundry.  She cooked her first meal with assistance yesterday.  She does report that after she completes a task she has to rest due to ongoing fatigue.  She continues to have dyspnea on exertion.  She denies fevers.  No longer having pleuritic pain with deep breath.  Her taste has returned slightly.  She notes that her sense of smell has returned.  Observations/Objective:   Gen: Awake, alert, no acute distress Resp: Breathing is even and non-labored Psych: calm/pleasant demeanor Neuro: Alert and Oriented x 3, + facial symmetry, speech is clear.   Assessment and Plan:  COVID-19 infection-patient is showing signs of improvement.  I do not feel that she is strong enough at this point to return to work.  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  She certainly does not have the stamina at this time.  I have recommended that she rest for the next 2 weeks and slowly advance her activity as tolerated.  The goal will be for her to return to work on October 11.  If  she finds that her strength is not improving over the next 2 weeks she will follow back up with me prior to her October 11 return date.  Of note she also has an upcoming appointment in our COVID-19 [facility] which I have encouraged her to keep.  15 minutes were spent on today's visit.   Follow Up Instructions:    I discussed the assessment and treatment plan with the patient. The patient was provided an opportunity to ask questions and all were answered. The patient agreed with the plan and demonstrated an understanding of the instructions.   The patient was advised to call back or seek an in-person evaluation if the symptoms worsen or if the condition fails to improve as anticipated.  Nicole Pear, NP

## 2019-11-26 ENCOUNTER — Other Ambulatory Visit: Payer: Self-pay

## 2019-11-26 ENCOUNTER — Ambulatory Visit (INDEPENDENT_AMBULATORY_CARE_PROVIDER_SITE_OTHER): Payer: 59 | Admitting: Nurse Practitioner

## 2019-11-26 VITALS — BP 148/92 | HR 108 | Temp 97.3°F | Ht 62.0 in | Wt 161.0 lb

## 2019-11-26 DIAGNOSIS — Z8616 Personal history of COVID-19: Secondary | ICD-10-CM

## 2019-11-26 DIAGNOSIS — R05 Cough: Secondary | ICD-10-CM | POA: Diagnosis not present

## 2019-11-26 DIAGNOSIS — R059 Cough, unspecified: Secondary | ICD-10-CM

## 2019-11-26 HISTORY — DX: Personal history of COVID-19: Z86.16

## 2019-11-26 MED ORDER — BENZONATATE 200 MG PO CAPS: 200.0000 mg | ORAL_CAPSULE | Freq: Two times a day (BID) | ORAL | 0 refills | Status: DC | PRN

## 2019-11-26 MED ORDER — BUDESONIDE-FORMOTEROL FUMARATE 80-4.5 MCG/ACT IN AERO: 2.0000 | INHALATION_SPRAY | Freq: Two times a day (BID) | RESPIRATORY_TRACT | 3 refills | Status: DC

## 2019-11-26 MED ORDER — PREDNISONE 10 MG PO TABS: ORAL_TABLET | ORAL | 0 refills | Status: DC

## 2019-11-26 NOTE — Assessment & Plan Note (Signed)
Cough History of asthma / cough variant asthma:  Will order Symbicort May continue albuterol as needed  May start zyrtec  May start Pepcid  Will order prednisone   Stay well hydrated  Stay active  Deep breathing exercises  May start vitamin C 2,000 mg daily, vitamin D3 2,000 IU daily, Zinc 220 mg daily, and Quercetin 500 mg twice daily  May take tylenol or fever or pain  May take mucinex DM twice daily     Follow up:  Follow up in 2 weeks or sooner if needed

## 2019-11-26 NOTE — Telephone Encounter (Signed)
Spoke with patient. Pt states the update has been done and doesn't need anything from [place] currently.

## 2019-11-26 NOTE — Progress Notes (Signed)
@Patient  ID: Nicole Larson, female    DOB: June 07, 1975, 44 y.o.   MRN: [id number]  Chief Complaint  Patient presents with  . New Patient (Initial Visit)    COVID Pos 9/3 Sx: Cough, fatigue    Referring provider: [name], NP   44 year old female with history of hypertension, obesity, anxiety.  Patient was diagnosed with Covid on 11/01/19.   HPI  Patient presents today for post COVID care clinic visit.  She was diagnosed with Covid in early September.  She has been to the ED several times for this issue.  She also had a video visit with her PCP yesterday.  She states that she is having chronic cough.  She is also having some shortness of breath and weakness when performing activities of daily living.  She has to take frequent with rest breaks.  Patient does have albuterol inhaler.  Patient states that she does have a history of asthma.  Her asthma has been well controlled over the last few years and she has not needed to use any inhalers. Denies f/c/s, n/v/d, hemoptysis, PND, chest pain or edema.     No Known Allergies  Immunization History  Administered Date(s) Administered  . Influenza,inj,Quad PF,6+ Mos 10/30/2018    Past Medical History:  Diagnosis Date  . Hypertension     Tobacco History: Social History   Tobacco Use  Smoking Status Never Smoker  Smokeless Tobacco Never Used   Counseling given: Not Answered   Outpatient Encounter Medications as of 11/26/2019  Medication Sig  . albuterol (VENTOLIN HFA) 108 (90 Base) MCG/ACT inhaler Inhale 2 puffs into the lungs every 6 (six) hours as needed for wheezing or shortness of breath.  Marland Kitchen amLODipine (NORVASC) 10 MG tablet Take 1 tablet (10 mg total) by mouth daily.  Marland Kitchen azithromycin (ZITHROMAX) 250 MG tablet Take 2 tabs by mouth today, then one tab once daily for 4 more days.  . Biotin 1000 MCG tablet Take 1,000 mcg by mouth daily.  . diclofenac (CATAFLAM) 50 MG tablet Take 1 tablet (50 mg total) by mouth 3 (three)  times daily.  Marland Kitchen HYDROcodone-acetaminophen (NORCO) 5-325 MG tablet Take 1 tablet by mouth every 6 (six) hours as needed.  Marland Kitchen ibuprofen (ADVIL) 800 MG tablet Take 1 tablet (800 mg total) by mouth 3 (three) times daily.  Marland Kitchen lisinopril (ZESTRIL) 10 MG tablet Take 1 tablet (10 mg total) by mouth daily.  . Multiple Vitamin (MULTIVITAMIN) tablet Take 1 tablet by mouth daily.  Marland Kitchen OVER THE COUNTER MEDICATION BLACKSEED OIL.  Take 2 teaspoons a day with honey.  . benzonatate (TESSALON) 200 MG capsule Take 1 capsule (200 mg total) by mouth 2 (two) times daily as needed for cough.  . budesonide-formoterol (SYMBICORT) 80-4.5 MCG/ACT inhaler Inhale 2 puffs into the lungs 2 (two) times daily.  . DULoxetine (CYMBALTA) 30 MG capsule Take 2 capsules (60 mg total) by mouth daily.  . predniSONE (DELTASONE) 10 MG tablet Take 4 tabs for 2 days, then 3 tabs for 2 days, then 2 tabs for 2 days, then 1 tab for 2 days, then stop   No facility-administered encounter medications on file as of 11/26/2019.     Review of Systems  Review of Systems  Constitutional: Negative.  Negative for fatigue and fever.  HENT: Negative.   Respiratory: Positive for cough and shortness of breath.   Cardiovascular: Negative.  Negative for chest pain, palpitations and leg swelling.  Gastrointestinal: Negative.   Allergic/Immunologic: Negative.   Neurological:  Negative.   Psychiatric/Behavioral: Negative.        Physical Exam  BP (!) 148/92 (BP Location: Right Arm)   Pulse (!) 108   Temp (!) 97.3 F (36.3 C)   Ht 5\' 2"  (1.575 m)   Wt 161 lb 0.1 oz (73 kg)   SpO2 98%   BMI 29.45 kg/m   Wt Readings from Last 5 Encounters:  11/26/19 161 lb 0.1 oz (73 kg)  11/19/19 164 lb 14.5 oz (74.8 kg)  11/06/19 165 lb (74.8 kg)  10/02/19 165 lb (74.8 kg)  08/12/19 164 lb (74.4 kg)     Physical Exam Vitals and nursing note reviewed.  Constitutional:      General: She is not in acute distress.    Appearance: She is well-developed.   Cardiovascular:     Rate and Rhythm: Normal rate and regular rhythm.  Pulmonary:     Effort: Pulmonary effort is normal.     Breath sounds: Normal breath sounds.  Musculoskeletal:     Right lower leg: No edema.     Left lower leg: No edema.  Neurological:     Mental Status: She is alert and oriented to person, place, and time.  Psychiatric:        Mood and Affect: Mood normal.        Behavior: Behavior normal.       Imaging: CT Angio Chest PE W and/or Wo Contrast  Result Date: 11/19/2019 CLINICAL DATA:  COVID pneumonia EXAM: CT ANGIOGRAPHY CHEST WITH CONTRAST TECHNIQUE: Multidetector CT imaging of the chest was performed using the standard protocol during bolus administration of intravenous contrast. Multiplanar CT image reconstructions and MIPs were obtained to evaluate the vascular anatomy. CONTRAST:  8mL OMNIPAQUE IOHEXOL 350 MG/ML SOLN COMPARISON:  None. FINDINGS: Cardiovascular: Contrast injection is sufficient to demonstrate satisfactory opacification of the pulmonary arteries to the segmental level. There is no pulmonary embolus or evidence of right heart strain. The size of the main pulmonary artery is normal. Heart size is normal, with no pericardial effusion. The course and caliber of the aorta are normal. There is no atherosclerotic calcification. Opacification decreased due to pulmonary arterial phase contrast bolus timing. Mediastinum/Nodes: -- No mediastinal lymphadenopathy. -- No hilar lymphadenopathy. -- No axillary lymphadenopathy. -- No supraclavicular lymphadenopathy. --there is a 1.2 cm left-sided thyroid nodule. No followup recommended (ref: J Am Coll Radiol. 2015 Feb;12(2): 143-50). -  Unremarkable esophagus. Lungs/Pleura: Airways are patent. No pleural effusion, lobar consolidation, pneumothorax or pulmonary infarction. Upper Abdomen: Contrast bolus timing is not optimized for evaluation of the abdominal organs. The visualized portions of the organs of the upper abdomen  are normal. Musculoskeletal: No chest wall abnormality. No bony spinal canal stenosis. Review of the MIP images confirms the above findings. IMPRESSION: No evidence of pulmonary embolism or other acute intrathoracic process. Electronically Signed   By: Constance Holster M.D.   On: 11/19/2019 17:42   DG Chest Portable 1 View  Result Date: 11/19/2019 CLINICAL DATA:  COVID positive EXAM: PORTABLE CHEST 1 VIEW COMPARISON:  11/06/2019 chest radiograph and prior. FINDINGS: The heart size and mediastinal contours are within normal limits. Both lungs are clear. No pneumothorax or pleural effusion. No acute osseous abnormality. IMPRESSION: No focal airspace disease. Electronically Signed   By: Primitivo Gauze M.D.   On: 11/19/2019 12:58   DG Chest Portable 1 View  Result Date: 11/06/2019 CLINICAL DATA:  COVID-19 pneumonia EXAM: PORTABLE CHEST 1 VIEW COMPARISON:  February 14, 2016 FINDINGS: There are hazy bilateral  airspace opacities most evident in the left mid and lower lung zone. There is no pneumothorax. No large pleural effusion the heart size is normal. There is no acute displaced fracture. IMPRESSION: Hazy bilateral airspace opacities concerning for multifocal pneumonia (viral or bacterial). Electronically Signed   By: Constance Holster M.D.   On: 11/06/2019 16:55     Assessment & Plan:   History of COVID-19 Cough History of asthma / cough variant asthma:  Will order Symbicort May continue albuterol as needed  May start zyrtec  May start Pepcid  Will order prednisone   Stay well hydrated  Stay active  Deep breathing exercises  May start vitamin C 2,000 mg daily, vitamin D3 2,000 IU daily, Zinc 220 mg daily, and Quercetin 500 mg twice daily  May take tylenol or fever or pain  May take mucinex DM twice daily     Follow up:  Follow up in 2 weeks or sooner if needed      Fenton Foy, NP 11/26/2019

## 2019-11-26 NOTE — Patient Instructions (Addendum)
Covid 19 Cough History of asthma / cough variant asthma:  Will order Symbicort May continue albuterol as needed  May start zyrtec  May start Pepcid  Will order prednisone   Stay well hydrated  Stay active  Deep breathing exercises  May start vitamin C 2,000 mg daily, vitamin D3 2,000 IU daily, Zinc 220 mg daily, and Quercetin 500 mg twice daily  May take tylenol or fever or pain  May take mucinex DM twice daily     Follow up:  Follow up in 2 weeks or sooner if needed

## 2019-11-26 NOTE — Telephone Encounter (Signed)
Patient states she needs matrix to be updated asap

## 2019-11-27 DIAGNOSIS — Z0279 Encounter for issue of other medical certificate: Secondary | ICD-10-CM

## 2019-12-03 ENCOUNTER — Telehealth: Payer: Self-pay | Admitting: Family

## 2019-12-03 DIAGNOSIS — R059 Cough, unspecified: Secondary | ICD-10-CM

## 2019-12-03 NOTE — Telephone Encounter (Signed)
Patient states she is taking diclofenac for her cough but is not working. She is taking Symbicort but also has to use her regular inhaler albuterol. She is finished prednisone. Patient states she still out of breath and coughing a lot.

## 2019-12-04 MED ORDER — PROMETHAZINE-DM 6.25-15 MG/5ML PO SYRP: 5.0000 mL | ORAL_SOLUTION | Freq: Four times a day (QID) | ORAL | 1 refills | Status: DC | PRN

## 2019-12-04 NOTE — Telephone Encounter (Signed)
Please advise 

## 2019-12-04 NOTE — Telephone Encounter (Signed)
Spoke to patient.  She states that she is still having severe cough.  Still very sob with exertion and fatigued.  Does not feel that she can return to work on 10/12.  She states tessalon not helping. Has follow up in [facility] on 10/12.  Advised pt to try promethazine/dM cough syrup. Will get a follow up cxr to rule out pna.  Will extend out of work through 10/18.

## 2019-12-04 NOTE — Telephone Encounter (Signed)
Patient is following up. 

## 2019-12-04 NOTE — Addendum Note (Signed)
Addended by: [name] on: 12/04/2019 01:54 PM   Modules accepted: Orders

## 2019-12-09 ENCOUNTER — Ambulatory Visit (HOSPITAL_BASED_OUTPATIENT_CLINIC_OR_DEPARTMENT_OTHER)
Admission: RE | Admit: 2019-12-09 | Discharge: 2019-12-09 | Disposition: A | Discharge status: Home / Self Care | Payer: 59 | Source: Ambulatory Visit | Attending: Family | Admitting: Family

## 2019-12-09 ENCOUNTER — Other Ambulatory Visit: Payer: Self-pay

## 2019-12-09 DIAGNOSIS — R059 Cough, unspecified: Secondary | ICD-10-CM | POA: Insufficient documentation

## 2019-12-10 ENCOUNTER — Ambulatory Visit (INDEPENDENT_AMBULATORY_CARE_PROVIDER_SITE_OTHER): Payer: 59 | Admitting: Nurse Practitioner

## 2019-12-10 VITALS — BP 122/72 | HR 102 | Temp 97.5°F | Ht 62.0 in | Wt 159.0 lb

## 2019-12-10 DIAGNOSIS — J45909 Unspecified asthma, uncomplicated: Secondary | ICD-10-CM | POA: Diagnosis not present

## 2019-12-10 DIAGNOSIS — R059 Cough, unspecified: Secondary | ICD-10-CM | POA: Diagnosis not present

## 2019-12-10 DIAGNOSIS — Z8616 Personal history of COVID-19: Secondary | ICD-10-CM

## 2019-12-10 MED ORDER — MONTELUKAST SODIUM 10 MG PO TABS: 10.0000 mg | ORAL_TABLET | Freq: Every day | ORAL | 3 refills | Status: DC

## 2019-12-10 MED ORDER — FAMOTIDINE 20 MG PO TABS: 20.0000 mg | ORAL_TABLET | Freq: Two times a day (BID) | ORAL | 0 refills | Status: DC

## 2019-12-10 NOTE — Progress Notes (Signed)
@Patient  ID: Nicole Larson, female    DOB: 12/01/1975, 44 y.o.   MRN: [id number]  Chief Complaint  Patient presents with  . 2 Week Follow-up    Still having cough and some SOB    Referring provider: [name], NP   44 year old female with history of hypertension, obesity, anxiety.  Patient was diagnosed with Covid on 11/01/19.   HPI  Patient presents today for post COVID care clinic visit.  She was diagnosed with Covid in early September. She has been to the ED several times for this issue.  She also had a couple video visits with her PCP.  Patient states that she is slowly improving.  She does still have significant cough.  Since her last visit here she has seen her primary care physician who has prescribed her cough syrup.  Patient was unable to start Zyrtec or Pepcid due to cost.  We will address in the Xanax prescription to see if it will be more cost effective. We will add Singulair.  May continue Mucinex DM. Denies f/c/s, n/v/d, hemoptysis, PND, chest pain or edema.        No Known Allergies  Immunization History  Administered Date(s) Administered  . Influenza,inj,Quad PF,6+ Mos 10/30/2018    Past Medical History:  Diagnosis Date  . Hypertension     Tobacco History: Social History   Tobacco Use  Smoking Status Never Smoker  Smokeless Tobacco Never Used   Counseling given: Yes   Outpatient Encounter Medications as of 12/10/2019  Medication Sig  . albuterol (VENTOLIN HFA) 108 (90 Base) MCG/ACT inhaler Inhale 2 puffs into the lungs every 6 (six) hours as needed for wheezing or shortness of breath.  Marland Kitchen amLODipine (NORVASC) 10 MG tablet Take 1 tablet (10 mg total) by mouth daily.  . Biotin 1000 MCG tablet Take 1,000 mcg by mouth daily.  . budesonide-formoterol (SYMBICORT) 80-4.5 MCG/ACT inhaler Inhale 2 puffs into the lungs 2 (two) times daily.  . diclofenac (CATAFLAM) 50 MG tablet Take 1 tablet (50 mg total) by mouth 3 (three) times daily.  Marland Kitchen  HYDROcodone-acetaminophen (NORCO) 5-325 MG tablet Take 1 tablet by mouth every 6 (six) hours as needed.  Marland Kitchen ibuprofen (ADVIL) 800 MG tablet Take 1 tablet (800 mg total) by mouth 3 (three) times daily.  Marland Kitchen lisinopril (ZESTRIL) 10 MG tablet Take 1 tablet (10 mg total) by mouth daily.  . Multiple Vitamin (MULTIVITAMIN) tablet Take 1 tablet by mouth daily.  Marland Kitchen OVER THE COUNTER MEDICATION BLACKSEED OIL.  Take 2 teaspoons a day with honey.  . promethazine-dextromethorphan (PROMETHAZINE-DM) 6.25-15 MG/5ML syrup Take 5 mLs by mouth 4 (four) times daily as needed for cough.  . [DISCONTINUED] predniSONE (DELTASONE) 10 MG tablet Take 4 tabs for 2 days, then 3 tabs for 2 days, then 2 tabs for 2 days, then 1 tab for 2 days, then stop  . benzonatate (TESSALON) 200 MG capsule Take 1 capsule (200 mg total) by mouth 2 (two) times daily as needed for cough. (Patient not taking: Reported on 12/10/2019)  . DULoxetine (CYMBALTA) 30 MG capsule Take 2 capsules (60 mg total) by mouth daily.  . famotidine (PEPCID) 20 MG tablet Take 1 tablet (20 mg total) by mouth 2 (two) times daily.  . montelukast (SINGULAIR) 10 MG tablet Take 1 tablet (10 mg total) by mouth at bedtime.  . [DISCONTINUED] azithromycin (ZITHROMAX) 250 MG tablet Take 2 tabs by mouth today, then one tab once daily for 4 more days.   No facility-administered  encounter medications on file as of 12/10/2019.     Review of Systems  Review of Systems  Constitutional: Negative.  Negative for fever.  HENT: Negative.   Respiratory: Positive for cough and shortness of breath.   Cardiovascular: Negative.  Negative for chest pain, palpitations and leg swelling.  Gastrointestinal: Negative.   Allergic/Immunologic: Negative.   Neurological: Negative.   Psychiatric/Behavioral: Negative.        Physical Exam  BP 122/72 (BP Location: Right Arm)   Pulse (!) 102   Temp (!) 97.5 F (36.4 C)   Ht 5\' 2"  (1.575 m)   Wt 159 lb 0.1 oz (72.1 kg)   SpO2 98%   BMI  29.08 kg/m   Wt Readings from Last 5 Encounters:  12/10/19 159 lb 0.1 oz (72.1 kg)  11/26/19 161 lb 0.1 oz (73 kg)  11/19/19 164 lb 14.5 oz (74.8 kg)  11/06/19 165 lb (74.8 kg)  10/02/19 165 lb (74.8 kg)     Physical Exam Vitals and nursing note reviewed.  Constitutional:      General: She is not in acute distress.    Appearance: She is well-developed.  Cardiovascular:     Rate and Rhythm: Normal rate and regular rhythm.  Pulmonary:     Effort: Pulmonary effort is normal.     Breath sounds: Normal breath sounds.  Musculoskeletal:     Right lower leg: No edema.     Left lower leg: No edema.  Neurological:     Mental Status: She is alert and oriented to person, place, and time.  Psychiatric:        Mood and Affect: Mood normal.        Behavior: Behavior normal.       Imaging: DG Chest 2 View  Result Date: 12/10/2019 CLINICAL DATA:  Cough. Status post COVID, ongoing cough, rule out pneumonia. EXAM: CHEST - 2 VIEW COMPARISON:  CT angiogram chest 11/19/2019. Chest radiograph 11/19/2019. FINDINGS: Heart size within normal limits. No appreciable airspace consolidation. No evidence of pleural effusion or pneumothorax. No acute bony abnormality identified. IMPRESSION: No evidence of active cardiopulmonary disease. Electronically Signed   By: Kellie Simmering DO   On: 12/10/2019 19:20   CT Angio Chest PE W and/or Wo Contrast  Result Date: 11/19/2019 CLINICAL DATA:  COVID pneumonia EXAM: CT ANGIOGRAPHY CHEST WITH CONTRAST TECHNIQUE: Multidetector CT imaging of the chest was performed using the standard protocol during bolus administration of intravenous contrast. Multiplanar CT image reconstructions and MIPs were obtained to evaluate the vascular anatomy. CONTRAST:  58mL OMNIPAQUE IOHEXOL 350 MG/ML SOLN COMPARISON:  None. FINDINGS: Cardiovascular: Contrast injection is sufficient to demonstrate satisfactory opacification of the pulmonary arteries to the segmental level. There is no  pulmonary embolus or evidence of right heart strain. The size of the main pulmonary artery is normal. Heart size is normal, with no pericardial effusion. The course and caliber of the aorta are normal. There is no atherosclerotic calcification. Opacification decreased due to pulmonary arterial phase contrast bolus timing. Mediastinum/Nodes: -- No mediastinal lymphadenopathy. -- No hilar lymphadenopathy. -- No axillary lymphadenopathy. -- No supraclavicular lymphadenopathy. --there is a 1.2 cm left-sided thyroid nodule. No followup recommended (ref: J Am Coll Radiol. 2015 Feb;12(2): 143-50). -  Unremarkable esophagus. Lungs/Pleura: Airways are patent. No pleural effusion, lobar consolidation, pneumothorax or pulmonary infarction. Upper Abdomen: Contrast bolus timing is not optimized for evaluation of the abdominal organs. The visualized portions of the organs of the upper abdomen are normal. Musculoskeletal: No chest wall abnormality.  No bony spinal canal stenosis. Review of the MIP images confirms the above findings. IMPRESSION: No evidence of pulmonary embolism or other acute intrathoracic process. Electronically Signed   By: Constance Holster M.D.   On: 11/19/2019 17:42   DG Chest Portable 1 View  Result Date: 11/19/2019 CLINICAL DATA:  COVID positive EXAM: PORTABLE CHEST 1 VIEW COMPARISON:  11/06/2019 chest radiograph and prior. FINDINGS: The heart size and mediastinal contours are within normal limits. Both lungs are clear. No pneumothorax or pleural effusion. No acute osseous abnormality. IMPRESSION: No focal airspace disease. Electronically Signed   By: Primitivo Gauze M.D.   On: 11/19/2019 12:58     Assessment & Plan:   History of COVID-19 Cough History of asthma / cough variant asthma:  Will order Symbicort  May continue albuterol as needed  May start zyrtec  May start Pepcid  Will order singulair  Cough: Continue gastroesophageal reflux disease treatment with elevating the  head your bed and taking antacids Continue taking over-the-counter antihistamines and nasal fluticasone to help with allergic rhinitis You need to try to suppress your cough to allow your larynx (voice box) to heal.  For three days don't talk, laugh, sing, or clear your throat. Do everything you can to suppress the cough during this time. Use hard candies (sugarless Jolly Ranchers) or non-mint or non-menthol containing cough drops during this time to soothe your throat.  Use a cough suppressant around the clock during this time.  After three days, gradually increase the use of your voice and back off on the cough suppressants.    Stay well hydrated  Stay active  Deep breathing exercises  May start vitamin C 2,000 mg daily, vitamin D3 2,000 IU daily, Zinc 220 mg daily, and Quercetin 500 mg twice daily  May take tylenol or fever or pain  May take mucinex DM twice daily     Follow up:  Follow up in 2 weeks or sooner if needed      Fenton Foy, NP 12/11/2019

## 2019-12-10 NOTE — Patient Instructions (Signed)
History of COVID-19 Cough History of asthma / cough variant asthma:  Will order Symbicort  May continue albuterol as needed  May start zyrtec  May start Pepcid  Will order singulair  Cough: Continue gastroesophageal reflux disease treatment with elevating the head your bed and taking antacids Continue taking over-the-counter antihistamines and nasal fluticasone to help with allergic rhinitis You need to try to suppress your cough to allow your larynx (voice box) to heal.  For three days don't talk, laugh, sing, or clear your throat. Do everything you can to suppress the cough during this time. Use hard candies (sugarless Jolly Ranchers) or non-mint or non-menthol containing cough drops during this time to soothe your throat.  Use a cough suppressant around the clock during this time.  After three days, gradually increase the use of your voice and back off on the cough suppressants.    Stay well hydrated  Stay active  Deep breathing exercises  May start vitamin C 2,000 mg daily, vitamin D3 2,000 IU daily, Zinc 220 mg daily, and Quercetin 500 mg twice daily  May take tylenol or fever or pain  May take mucinex DM twice daily     Follow up:  Follow up in 2 weeks or sooner if needed

## 2019-12-11 DIAGNOSIS — J45909 Unspecified asthma, uncomplicated: Secondary | ICD-10-CM | POA: Insufficient documentation

## 2019-12-11 NOTE — Telephone Encounter (Signed)
Patient is requesting to extend her short term disability for one more week, patient states she is slowly improving.

## 2019-12-11 NOTE — Assessment & Plan Note (Signed)
Cough History of asthma / cough variant asthma:  Will order Symbicort  May continue albuterol as needed  May start zyrtec  May start Pepcid  Will order singulair  Cough: Continue gastroesophageal reflux disease treatment with elevating the head your bed and taking antacids Continue taking over-the-counter antihistamines and nasal fluticasone to help with allergic rhinitis You need to try to suppress your cough to allow your larynx (voice box) to heal.  For three days don't talk, laugh, sing, or clear your throat. Do everything you can to suppress the cough during this time. Use hard candies (sugarless Jolly Ranchers) or non-mint or non-menthol containing cough drops during this time to soothe your throat.  Use a cough suppressant around the clock during this time.  After three days, gradually increase the use of your voice and back off on the cough suppressants.    Stay well hydrated  Stay active  Deep breathing exercises  May start vitamin C 2,000 mg daily, vitamin D3 2,000 IU daily, Zinc 220 mg daily, and Quercetin 500 mg twice daily  May take tylenol or fever or pain  May take mucinex DM twice daily     Follow up:  Follow up in 2 weeks or sooner if needed

## 2019-12-12 ENCOUNTER — Encounter: Payer: Self-pay | Admitting: Nurse Practitioner

## 2019-12-12 NOTE — Telephone Encounter (Signed)
Patient requesting extension of disability time

## 2019-12-20 ENCOUNTER — Other Ambulatory Visit: Payer: Self-pay

## 2019-12-20 ENCOUNTER — Telehealth (INDEPENDENT_AMBULATORY_CARE_PROVIDER_SITE_OTHER): Payer: 59 | Admitting: Family

## 2019-12-20 DIAGNOSIS — F419 Anxiety disorder, unspecified: Secondary | ICD-10-CM

## 2019-12-20 DIAGNOSIS — U099 Post covid-19 condition, unspecified: Secondary | ICD-10-CM

## 2019-12-20 MED ORDER — BUSPIRONE HCL 15 MG PO TABS: ORAL_TABLET | ORAL | 0 refills | Status: DC

## 2019-12-20 NOTE — Progress Notes (Signed)
Virtual Visit via Video Note  I connected with Nicole Larson on 12/20/19 at  1:40 PM EDT by a video enabled telemedicine application and verified that I am speaking with the correct person using two identifiers.  Location: Patient: home Provider: work    I discussed the limitations of evaluation and management by telemedicine and the availability of in person appointments. The patient expressed understanding and agreed to proceed. Only the patient and myself were present for today's video call.   History of Present Illness:  Patient is a 44 year old female who presents today for follow-up of her COVID-19 infection which she acquired in the end of August 2021.  She continues to have some fatigue and ongoing cough.  She is also being followed in the COVID-19 long hauler clinic.  She follows back up with that clinic next Tuesday.  The provider told her that if her cough is not improved by Tuesday then she plans to place a referral to pulmonology.  Patient states that she is compliant with her Singulair and her Symbicort.  She also uses albuterol on an as-needed basis.  In terms of her overall function, she reports that she has been able to start doing some activities of daily living.  She has been able to do some laundry and cooking.  She has not been able to vacuum as this is too strenuous.  She wants to return to work, however she does not yet feel strong enough to do 12-hour shift.  Anxiety-she also reports ongoing concerns about anxiety.  Reports that she panics easily.  Finds herself worrying at times.She states she has tried multiple natural remedies without much improvement.  Previous medications she has not tolerated: lexapro (GI upset),  cymbalta- nausea.   Has phobia of bridges.     Observations/Objective:   Gen: Awake, alert, no acute distress Resp: Breathing is even and non-labored Psych: calm/pleasant demeanor Neuro: Alert and Oriented x 3, + facial symmetry, speech is  clear.   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  She will plan to follow-up in the COVID-19 clinic next Tuesday, with a plan to return to work next Wednesday for 4-hour shifts only.  She will contact me late next week to let me know how she is doing with the 4-hour shifts.  We can slowly advance her work shifts as tolerated.  Anxiety-uncontrolled.  She has tolerated buspirone in the past, but noted no significant improvement in her symptoms on the 7.5 mg dosing regimen.  I advised the patient to begin 15 mg tabs, cut in half and take half tablet twice daily for 2 weeks and then increase to a full tablet twice daily.  Plan to bring her back in 1 month for follow-up of her anxiety.  Follow Up Instructions:    I discussed the assessment and treatment plan with the patient. The patient was provided an opportunity to ask questions and all were answered. The patient agreed with the plan and demonstrated an understanding of the instructions.   The patient was advised to call back or seek an in-person evaluation if the symptoms worsen or if the condition fails to improve as anticipated.  Nance Pear, NP

## 2019-12-24 ENCOUNTER — Telehealth: Payer: Self-pay | Admitting: Family

## 2019-12-24 NOTE — Telephone Encounter (Signed)
Patient was upset about her schedule, she is set to work 4 hours a day but "it is a different times each day and it is for 14 days in a row to start. She will start tomorrow and will call on Friday to let provider know how she is doing. "May need change to 4 hours a day no more than 4 or 5 days in a row"

## 2019-12-24 NOTE — Telephone Encounter (Signed)
Patient calling in reference to returning to work note, patient states she received her work scheduled this week and she can not do that work. She wants to speak with someone from office to help her with another note for her employer.

## 2019-12-25 ENCOUNTER — Ambulatory Visit (INDEPENDENT_AMBULATORY_CARE_PROVIDER_SITE_OTHER): Payer: 59 | Admitting: Nurse Practitioner

## 2019-12-25 VITALS — BP 128/82 | HR 95 | Temp 97.3°F | Ht 62.0 in | Wt 167.0 lb

## 2019-12-25 DIAGNOSIS — R059 Cough, unspecified: Secondary | ICD-10-CM

## 2019-12-25 DIAGNOSIS — Z8616 Personal history of COVID-19: Secondary | ICD-10-CM | POA: Diagnosis not present

## 2019-12-25 DIAGNOSIS — R5381 Other malaise: Secondary | ICD-10-CM

## 2019-12-25 DIAGNOSIS — Z8709 Personal history of other diseases of the respiratory system: Secondary | ICD-10-CM

## 2019-12-25 MED ORDER — PREDNISONE 10 MG PO TABS: ORAL_TABLET | ORAL | 0 refills | Status: DC

## 2019-12-25 NOTE — Progress Notes (Signed)
@Patient  ID: Nicole Larson, female    DOB: [birth year], 44 y.o.   MRN: [id number]  Chief Complaint  Patient presents with  . Follow-up    Coughing up mucus, SOB    Referring provider: [name], NP  44 year old female with history of hypertension, obesity, anxiety. Patient does have a history of asthma. Patient was diagnosed with Covid on 11/01/19.   HPI  Patient presents today for post COVID care clinic visit / follow up.  She was diagnosed with Covid in early September.  She went to the ED several times for this issue. Patient states that since her last visit here she has slightly improved, but still has significant cough. Recent chest xray was clear.   Patient does have albuterol inhaler and was started on Symbicort at last visit.  Patient states that she does have a history of asthma.  Her asthma has been well controlled over the last few years and she has not needed to use any inhalers until becoming ill with Covid. She is also having some shortness of breath and weakness when performing activities of daily living.  She has to take frequent with rest breaks.Denies f/c/s, n/v/d, hemoptysis, PND, chest pain or edema.      No Known Allergies  Immunization History  Administered Date(s) Administered  . Influenza,inj,Quad PF,6+ Mos 10/30/2018    Past Medical History:  Diagnosis Date  . Hypertension     Tobacco History: Social History   Tobacco Use  Smoking Status Never Smoker  Smokeless Tobacco Never Used   Counseling given: Yes   Outpatient Encounter Medications as of 12/25/2019  Medication Sig  . albuterol (VENTOLIN HFA) 108 (90 Base) MCG/ACT inhaler Inhale 2 puffs into the lungs every 6 (six) hours as needed for wheezing or shortness of breath.  Marland Kitchen amLODipine (NORVASC) 10 MG tablet Take 1 tablet (10 mg total) by mouth daily.  . Biotin 1000 MCG tablet Take 1,000 mcg by mouth daily.  . budesonide-formoterol (SYMBICORT) 80-4.5 MCG/ACT inhaler Inhale 2 puffs  into the lungs 2 (two) times daily.  . busPIRone (BUSPAR) 15 MG tablet 1/2 tab twice daily for 2 weeks, then increase to a full tab twice daily.  . diclofenac (CATAFLAM) 50 MG tablet Take 1 tablet (50 mg total) by mouth 3 (three) times daily.  . famotidine (PEPCID) 20 MG tablet Take 1 tablet (20 mg total) by mouth 2 (two) times daily.  Marland Kitchen lisinopril (ZESTRIL) 10 MG tablet Take 1 tablet (10 mg total) by mouth daily.  . montelukast (SINGULAIR) 10 MG tablet Take 1 tablet (10 mg total) by mouth at bedtime.  . Multiple Vitamin (MULTIVITAMIN) tablet Take 1 tablet by mouth daily.  Marland Kitchen OVER THE COUNTER MEDICATION BLACKSEED OIL.  Take 2 teaspoons a day with honey.  . promethazine-dextromethorphan (PROMETHAZINE-DM) 6.25-15 MG/5ML syrup Take 5 mLs by mouth 4 (four) times daily as needed for cough.  . predniSONE (DELTASONE) 10 MG tablet Take 4 tabs for 2 days, then 3 tabs for 2 days, then 2 tabs for 2 days, then 1 tab for 2 days, then stop  . [DISCONTINUED] benzonatate (TESSALON) 200 MG capsule Take 1 capsule (200 mg total) by mouth 2 (two) times daily as needed for cough. (Patient not taking: Reported on 12/25/2019)   No facility-administered encounter medications on file as of 12/25/2019.     Review of Systems  Review of Systems  Constitutional: Positive for fatigue. Negative for fever.  HENT: Negative.   Respiratory: Positive for cough and shortness  of breath.   Cardiovascular: Negative.  Negative for chest pain, palpitations and leg swelling.  Gastrointestinal: Negative.   Allergic/Immunologic: Negative.   Neurological: Positive for weakness.  Psychiatric/Behavioral: Negative.        Physical Exam  BP 128/82 (BP Location: Right Arm)   Pulse 95   Temp (!) 97.3 F (36.3 C)   Ht 5\' 2"  (1.575 m)   Wt 167 lb (75.8 kg)   LMP 12/21/2019   SpO2 98%   BMI 30.54 kg/m   Wt Readings from Last 5 Encounters:  12/25/19 167 lb (75.8 kg)  12/10/19 159 lb 0.1 oz (72.1 kg)  11/26/19 161 lb 0.1 oz  (73 kg)  11/19/19 164 lb 14.5 oz (74.8 kg)  11/06/19 165 lb (74.8 kg)     Physical Exam Vitals and nursing note reviewed.  Constitutional:      General: She is not in acute distress.    Appearance: She is well-developed.  Cardiovascular:     Rate and Rhythm: Normal rate and regular rhythm.  Pulmonary:     Effort: Pulmonary effort is normal.     Breath sounds: Normal breath sounds. No wheezing or rhonchi.  Neurological:     Mental Status: She is alert and oriented to person, place, and time.      Imaging: DG Chest 2 View  Result Date: 12/10/2019 CLINICAL DATA:  Cough. Status post COVID, ongoing cough, rule out pneumonia. EXAM: CHEST - 2 VIEW COMPARISON:  CT angiogram chest 11/19/2019. Chest radiograph 11/19/2019. FINDINGS: Heart size within normal limits. No appreciable airspace consolidation. No evidence of pleural effusion or pneumothorax. No acute bony abnormality identified. IMPRESSION: No evidence of active cardiopulmonary disease. Electronically Signed   By: Kellie Simmering DO   On: 12/10/2019 19:20     Assessment & Plan:   History of COVID-19 Cough History of asthma / cough variant asthma:  Will order Symbicort  May continue albuterol as needed  May continue zyrtec  May continue Pepcid  Will order singulair  Cough: Continue gastroesophageal reflux disease treatment with elevating the head your bed and taking antacids Continue taking over-the-counter antihistamines and nasal fluticasone to help with allergic rhinitis Take sips of water when you feel the need to cough or clear your throat Use hard candies (sugarless Jolly Ranchers) or non-mint or non-menthol containing cough drops during this time to soothe your throat.    Stay well hydrated  Stay active  Deep breathing exercises  Continue vitamin C 2,000 mg daily, vitamin D3 2,000 IU daily, Zinc 220 mg daily, and Quercetin 500 mg twice daily  May take tylenol or fever or pain  May take  mucinex DM twice daily  Will order prednisone   Physical Deconditioning:  Will place referral to PT  Stay active     Follow up:  Follow up after seen by pulmonary or sooner if needed      Fenton Foy, NP 12/25/2019

## 2019-12-25 NOTE — Patient Instructions (Addendum)
History of COVID-19 Cough History of asthma / cough variant asthma:  Will order Symbicort  May continue albuterol as needed  May continue zyrtec  May continue Pepcid  Will order singulair  Cough: Continue gastroesophageal reflux disease treatment with elevating the head your bed and taking antacids Continue taking over-the-counter antihistamines and nasal fluticasone to help with allergic rhinitis Take sips of water when you feel the need to cough or clear your throat Use hard candies (sugarless Jolly Ranchers) or non-mint or non-menthol containing cough drops during this time to soothe your throat.    Stay well hydrated  Stay active  Deep breathing exercises  Continue vitamin C 2,000 mg daily, vitamin D3 2,000 IU daily, Zinc 220 mg daily, and Quercetin 500 mg twice daily  May take tylenol or fever or pain  May take mucinex DM twice daily  Will order prednisone   Physical Deconditioning:  Will place referral to PT  Stay active     Follow up:  Follow up after seen by pulmonary or sooner if needed

## 2019-12-25 NOTE — Assessment & Plan Note (Signed)
Cough History of asthma / cough variant asthma:  Will order Symbicort  May continue albuterol as needed  May continue zyrtec  May continue Pepcid  Will order singulair  Cough: Continue gastroesophageal reflux disease treatment with elevating the head your bed and taking antacids Continue taking over-the-counter antihistamines and nasal fluticasone to help with allergic rhinitis Take sips of water when you feel the need to cough or clear your throat Use hard candies (sugarless Jolly Ranchers) or non-mint or non-menthol containing cough drops during this time to soothe your throat.    Stay well hydrated  Stay active  Deep breathing exercises  Continue vitamin C 2,000 mg daily, vitamin D3 2,000 IU daily, Zinc 220 mg daily, and Quercetin 500 mg twice daily  May take tylenol or fever or pain  May take mucinex DM twice daily  Will order prednisone   Physical Deconditioning:  Will place referral to PT  Stay active     Follow up:  Follow up after seen by pulmonary or sooner if needed

## 2019-12-26 ENCOUNTER — Ambulatory Visit (INDEPENDENT_AMBULATORY_CARE_PROVIDER_SITE_OTHER): Payer: 59 | Admitting: Internal Medicine

## 2019-12-26 ENCOUNTER — Encounter: Payer: Self-pay | Admitting: Internal Medicine

## 2019-12-26 ENCOUNTER — Other Ambulatory Visit: Payer: Self-pay

## 2019-12-26 DIAGNOSIS — R058 Other specified cough: Secondary | ICD-10-CM

## 2019-12-26 DIAGNOSIS — Z8616 Personal history of COVID-19: Secondary | ICD-10-CM | POA: Diagnosis not present

## 2019-12-26 DIAGNOSIS — I10 Essential (primary) hypertension: Secondary | ICD-10-CM

## 2019-12-26 MED ORDER — PANTOPRAZOLE SODIUM 40 MG PO TBEC: 40.0000 mg | DELAYED_RELEASE_TABLET | Freq: Every day | ORAL | 2 refills | Status: DC

## 2019-12-26 MED ORDER — VALSARTAN 160 MG PO TABS: 160.0000 mg | ORAL_TABLET | Freq: Every day | ORAL | 11 refills | Status: DC

## 2019-12-26 NOTE — Patient Instructions (Addendum)
Stop lisinorpil (zestril) 10 mg and replace with valsartan 160 mg one daily in its place  Pantoprazole (protonix) 40 mg   Take  30-60 min before first meal of the day and Pepcid (famotidine)  20 mg one @  bedtime until return to office - this is the best way to tell whether stomach acid is contributing to your problem.    GERD (REFLUX)  is an extremely common cause of respiratory symptoms just like yours , many times with no obvious heartburn at all.    It can be treated with medication, but also with lifestyle changes including elevation of the head of your bed (ideally with 6 -8inch blocks under the headboard of your bed),  Smoking cessation, avoidance of late meals, excessive alcohol, and avoid fatty foods, chocolate, peppermint, colas, red wine, and acidic juices such as orange juice.  NO MINT OR MENTHOL PRODUCTS SO NO COUGH DROPS  USE SUGARLESS CANDY INSTEAD (Jolley ranchers or Stover's or Life Savers) or even ice chips will also do - the key is to swallow to prevent all throat clearing. NO OIL BASED VITAMINS - use powdered substitutes.  Avoid fish oil when coughing.    Please schedule a follow up office visit in 6 weeks, call sooner if needed

## 2019-12-26 NOTE — Telephone Encounter (Signed)
Patient states the lung doctor told her to stop her blood pressure medication

## 2019-12-26 NOTE — Progress Notes (Signed)
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   History of Present Illness  12/26/2019  Pulmonary/ 1st office eval/Nicole Larson  Chief Complaint  Patient presents with  . Consult    cough with activity, Covid Septermber 2021.   Dyspnea:  X 5 houses slt uphill / some coughing with activity  Cough: variably productive esp  p stirs white mucus  Sleep: fine p cough meds  SABA use: used night prior to OV   on way to work   No obvious day to day or daytime variability or assoc excess/ purulent sputum or mucus plugs or hemoptysis or cp or chest tightness, subjective wheeze or overt sinus or hb symptoms.   Sleeping as above without nocturnal  or early am exacerbation  of respiratory  c/o's or need for noct saba. Also denies any obvious fluctuation of symptoms with weather or environmental changes or other aggravating or alleviating factors except as outlined above   No unusual exposure hx or h/o childhood pna/ asthma or knowledge of premature birth.  Current Allergies, Complete Past Medical History, Past Surgical History, Family History, and Social History were reviewed in [employer] record.  ROS  The following are not active complaints unless bolded Hoarseness, sore throat=globus sensation, dysphagia, dental problems, itching, sneezing,  nasal congestion or discharge of excess mucus or purulent secretions, ear  ache,   fever, chills, sweats, unintended wt loss or wt gain, classically pleuritic or exertional cp,  orthopnea pnd or arm/hand swelling  or leg swelling, presyncope, palpitations, abdominal pain, anorexia, nausea, vomiting, diarrhea  or change in bowel habits or change in bladder habits, change in stools or change in urine, dysuria, hematuria,  rash, arthralgias, visual complaints, headache, numbness, weakness or ataxia or problems with walking or coordination,  change in mood or  memory.             Past Medical History:  Diagnosis Date  . Asthma   . Hypertension   . Pneumonia     Outpatient Medications Prior to Visit  Medication Sig Dispense Refill  . albuterol (VENTOLIN HFA) 108 (90 Base) MCG/ACT inhaler Inhale 2 puffs into the lungs every 6 (six) hours as needed for wheezing or shortness of breath. 6.7 g 5  . amLODipine (NORVASC) 10 MG tablet Take 1 tablet (10 mg total) by mouth daily. 90 tablet 1  . Biotin 1000 MCG tablet Take 1,000 mcg by mouth daily.    . budesonide-formoterol (SYMBICORT) 80-4.5 MCG/ACT inhaler Inhale 2 puffs into the lungs 2 (two) times daily. 1 each 3  . busPIRone (BUSPAR) 15 MG tablet 1/2 tab twice daily for 2 weeks, then increase to a full tab twice daily. 60 tablet 0  . diclofenac (CATAFLAM) 50 MG tablet Take 1 tablet (50 mg total) by mouth 3 (three) times  daily. 30 tablet 2  . famotidine (PEPCID) 20 MG tablet Take 1 tablet (20 mg total) by mouth 2 (two) times daily. 60 tablet 0  . lisinopril (ZESTRIL) 10 MG tablet Take 1 tablet (10 mg total) by mouth daily. 30 tablet 2  . montelukast (SINGULAIR) 10 MG tablet Take 1 tablet (10 mg total) by mouth at bedtime. 30 tablet 3  . Multiple Vitamin (MULTIVITAMIN) tablet Take 1 tablet by mouth daily.    Marland Kitchen OVER THE COUNTER MEDICATION BLACKSEED OIL.  Take 2 teaspoons a day with honey.    . promethazine-dextromethorphan (PROMETHAZINE-DM) 6.25-15 MG/5ML syrup Take 5 mLs by mouth 4 (four) times daily as needed for cough. 200  mL 1  . predniSONE (DELTASONE) 10 MG tablet Take 4 tabs for 2 days, then 3 tabs for 2 days, then 2 tabs for 2 days, then 1 tab for 2 days, then stop 20 tablet 0      Objective:     BP 134/82 (BP Location: Left Arm, Cuff Size: Normal)   Pulse 100   Temp 98.4 F (36.9 C)   Ht 5\' 2"  (1.575 m)   Wt 167 lb 3.2 oz (75.8 kg)   LMP 12/21/2019   SpO2 99% Comment: RA  BMI 30.58 kg/m   SpO2: 99 % (RA)   amb bf nad with dry cough and throat clearing noted on interview and exam   HEENT :wearing mask during ov    NECK :  without JVD/Nodes/TM/ nl carotid upstrokes bilaterally   LUNGS: no acc muscle use,  Nl contour chest which is clear to A and P bilaterally without cough on insp or exp maneuvers   CV:  RRR  no s3 or murmur or increase in P2, and no edema   ABD:  soft and nontender with nl inspiratory excursion in the supine position. No bruits or organomegaly appreciated, bowel sounds nl  MS:  Nl gait/ ext warm without deformities, calf tenderness, cyanosis or clubbing No obvious joint restrictions   SKIN: warm and dry without lesions    NEURO:  alert, approp, nl sensorium with  no motor or cerebellar deficits apparent.    I personally reviewed images and agree with radiology impression as follows:  CXR:   12/10/19  No evidence of active cardiopulmonary disease    Assessment   History of COVID-19 No evidence of ongoing infection / inflammation from covid as suspect the cough is entirely upper airway (see separate a/p)      Upper airway cough syndrome Onset in Aug 2021 related to covid - d/c acei 12/26/2019 with max rx for gerd   Upper airway cough syndrome (previously labeled PNDS),  is so named because it's frequently impossible to sort out how much is  CR/sinusitis with freq throat clearing (which can be related to primary GERD)   vs  causing  secondary (" extra esophageal")  GERD from wide swings in gastric pressure that occur with throat clearing, often  promoting  self use of mint and menthol lozenges that reduce the lower esophageal sphincter tone and exacerbate the problem further in a cyclical fashion.   These are the same pts (now being labeled as having "irritable larynx syndrome" by some cough centers) who not infrequently have a history of having failed to tolerate ace inhibitors,  dry powder inhalers or biphosphonates or report having atypical/extraesophageal reflux symptoms (eg globus sensation as in her case)  that don't respond to standard doses of PPI  and are easily confused as having  aecopd or asthma flares by even experienced allergists/ pulmonologists (myself included).   >>> rec off acei, max gerd rx and f/u in 6 weeks.   >>> continue prn saba  for now but should be able to taper completely off soon and then see how much if any asthma is present.     Essential hypertension In the best review of chronic cough to date ( NEJM [phone]) ,  ACEi are now felt to cause cough in up to  20% of pts which is a 4 fold increase from previous reports and does not include the variety of non-specific complaints we see in pulmonary clinic in pts on ACEi but previously attributed to another dx like  Copd/asthma and  include PNDS, throat and chest congestion, "bronchitis", unexplained dyspnea and noct "strangling" sensations, and hoarseness, but also  atypical /refractory GERD symptoms like globus   dysphagia and "bad heartburn"   The only way I know  to prove this is not an "ACEi Case" is a trial off ACEi x a minimum of 6 weeks then regroup.   >>> try diovan 160 mg daily and f/u in 6 weeks        Each maintenance medication was reviewed in detail including emphasizing most importantly the difference between maintenance and prns and under what circumstances the prns are to be triggered using an action plan format where appropriate.  Total time for H and P, chart review, counseling, teaching device and generating customized AVS unique to this  office visit / charting = 47 min        Christinia Gully, MD 12/26/2019

## 2019-12-26 NOTE — Telephone Encounter (Signed)
Patient is calling back to let you know about her first day back to work was last night. She had a rough night, she had to stop multiple time due to coughing and sob had to use her inhaler.  Patient states she has an appointment today with the lung specialist. She states her covid doctor recommend her some PT. She is waiting on appointment.

## 2019-12-27 ENCOUNTER — Encounter: Payer: Self-pay | Admitting: Internal Medicine

## 2019-12-27 DIAGNOSIS — R058 Other specified cough: Secondary | ICD-10-CM

## 2019-12-27 HISTORY — DX: Other specified cough: R05.8

## 2019-12-27 NOTE — Assessment & Plan Note (Addendum)
In the best review of chronic cough to date ( NEJM 2016 375 [phone]) ,  ACEi are now felt to cause cough in up to  20% of pts which is a 4 fold increase from previous reports and does not include the variety of non-specific complaints we see in pulmonary clinic in pts on ACEi but previously attributed to another dx like  Copd/asthma and  include PNDS, throat and chest congestion, "bronchitis", unexplained dyspnea and noct "strangling" sensations, and hoarseness, but also  atypical /refractory GERD symptoms like globus   dysphagia and "bad heartburn"   The only way I know  to prove this is not an "ACEi Case" is a trial off ACEi x a minimum of 6 weeks then regroup.   >>> try diovan 160 mg daily and f/u in 6 weeks            Each maintenance medication was reviewed in detail including emphasizing most importantly the difference between maintenance and prns and under what circumstances the prns are to be triggered using an action plan format where appropriate.  Total time for H and P, chart review, counseling, teaching device and generating customized AVS unique to this office visit / charting = 47 min

## 2019-12-27 NOTE — Assessment & Plan Note (Signed)
No evidence of ongoing infection / inflammation from covid as suspect the cough is entirely upper airway (see separate a/p)

## 2019-12-27 NOTE — Telephone Encounter (Signed)
Patient calling in reference to lung doctor and would Melissa's thoughts on stopping the blood pressure medication.  Please Advise

## 2019-12-27 NOTE — Assessment & Plan Note (Addendum)
Onset in Aug 2021 related to covid - d/c acei 12/26/2019 with max rx for gerd   Upper airway cough syndrome (previously labeled PNDS),  is so named because it's frequently impossible to sort out how much is  CR/sinusitis with freq throat clearing (which can be related to primary GERD)   vs  causing  secondary (" extra esophageal")  GERD from wide swings in gastric pressure that occur with throat clearing, often  promoting self use of mint and menthol lozenges that reduce the lower esophageal sphincter tone and exacerbate the problem further in a cyclical fashion.   These are the same pts (now being labeled as having "irritable larynx syndrome" by some cough centers) who not infrequently have a history of having failed to tolerate ace inhibitors,  dry powder inhalers or biphosphonates or report having atypical/extraesophageal reflux symptoms (eg globus sensation as in her case)  that don't respond to standard doses of PPI  and are easily confused as having aecopd or asthma flares by even experienced allergists/ pulmonologists (myself included).   >>> rec off acei, max gerd rx and f/u in 6 weeks.   >>> continue prn saba  for now but should be able to taper completely off soon and then see how much if any asthma is present.

## 2019-12-30 NOTE — Telephone Encounter (Signed)
She should stop lisinopril. Did she only work the 4 hr shift one night?

## 2019-12-30 NOTE — Telephone Encounter (Signed)
She reports she worked Thursday and Pulmonologist took her out of work Friday and the weekend, she is supposed to work today from 7 p to 11p.  Patient advised to dc lisinopril.

## 2019-12-30 NOTE — Telephone Encounter (Signed)
Patient would like a call back

## 2019-12-30 NOTE — Telephone Encounter (Signed)
Patient was started on Diovan per lung doctor. Patient not sure as to which bp meds she needs to be on.  She also was advised by pulmonologist that she will not be breathing at full capacity for at least another 4 weeks and to start physical therapy, she has an appt. for that on 11-16. She will like to be out of work for another 2 weeks until she starts physical therapy (she is trying to move PT appointment up)

## 2019-12-31 ENCOUNTER — Encounter: Payer: Self-pay | Admitting: Family

## 2020-01-02 ENCOUNTER — Telehealth: Payer: Self-pay | Admitting: Family

## 2020-01-02 NOTE — Telephone Encounter (Signed)
[employer] paperwork faxed today (7 pages). Document put at front office tray under providers name.

## 2020-01-02 NOTE — Telephone Encounter (Signed)
Patient wants to know if you have received [employer] paperwork that was faxed on 12/26/19. If they don't get it back, she won't get paid and her bills are due.

## 2020-01-02 NOTE — Telephone Encounter (Signed)
Patient called again asking if this can be filled out  ASAP. So that she dont miss out on benefits and payment.

## 2020-01-02 NOTE — Telephone Encounter (Signed)
We received paperwork twice now.  I have placed both copies on your desk.

## 2020-01-02 NOTE — Telephone Encounter (Signed)
Patient advised provider not in office today, will try to get completed by tomorrow.

## 2020-01-03 ENCOUNTER — Other Ambulatory Visit: Payer: Self-pay | Admitting: Nurse Practitioner

## 2020-01-03 MED ORDER — FAMOTIDINE 20 MG PO TABS: 20.0000 mg | ORAL_TABLET | Freq: Two times a day (BID) | ORAL | 0 refills | Status: DC

## 2020-01-08 ENCOUNTER — Encounter: Payer: Self-pay | Admitting: Family

## 2020-01-14 ENCOUNTER — Telehealth: Payer: Self-pay | Admitting: Family

## 2020-01-14 ENCOUNTER — Other Ambulatory Visit: Payer: Self-pay

## 2020-01-14 ENCOUNTER — Ambulatory Visit: Payer: 59 | Attending: Nurse Practitioner

## 2020-01-14 VITALS — HR 107

## 2020-01-14 DIAGNOSIS — Z8616 Personal history of COVID-19: Secondary | ICD-10-CM | POA: Diagnosis present

## 2020-01-14 DIAGNOSIS — R5381 Other malaise: Secondary | ICD-10-CM

## 2020-01-14 NOTE — Telephone Encounter (Signed)
Spoke to patient re: Covid-19 vaccine exemption form. She did not receive monoclonal antibody treatment so vaccine does not need to be postponed. I recommended that pt receive vaccine and advised her that she does not have a medical indication for exemption. Pt verbalized understanding.

## 2020-01-14 NOTE — Patient Instructions (Signed)
diaphramatic breathing with emphasis on exhaition  3-4 x the tim for inhalation  5-10 eps 4-5x/day sit stand or lying

## 2020-01-14 NOTE — Therapy (Addendum)
[place], [place], [address] Phone: [phone]   Fax:  [phone]  Physical Therapy Evaluation/ Discharge  Patient Details  Name: Nicole Larson MRN: [id number] Date of Birth: Apr 16, 1975 Referring Provider (PT): Lazaro Arms.NP   Encounter Date: 01/14/2020   PT End of Session - 01/14/20 1309    Visit Number 1    Number of Visits 12    Date for PT Re-Evaluation 02/21/20    Authorization Type [employer]    PT Start Time 1145   Pt late   PT Stop Time 1215    PT Time Calculation (min) 30 min    Activity Tolerance Patient tolerated treatment well    Behavior During Therapy [facility] for tasks assessed/performed           Past Medical History:  Diagnosis Date  . Asthma   . Hypertension   . Pneumonia     Past Surgical History:  Procedure Laterality Date  . TUBAL LIGATION      Vitals:   01/14/20 1155  Pulse: (!) 107  SpO2: 99%      Subjective Assessment - 01/14/20 1147    Subjective She report she still gets winded /SOB and needs to rest . This  has been since 11/01/19.    Limitations Walking    Currently in Pain? No/denies              [facility] PT Assessment - 01/14/20 0001      Assessment   Medical Diagnosis deconditioning    Referring Provider (PT) Lazaro Arms.NP    Onset Date/Surgical Date 11/01/19    Next MD Visit As needed    Prior Therapy no      Precautions   Precautions None      Restrictions   Weight Bearing Restrictions No      Balance Screen   Has the patient fallen in the past 6 months No      [place] residence    Living Arrangements Children    Type of [place] to enter    Entrance Stairs-Number of Steps 2    Entrance Stairs-Rails Right    [place] One level      Prior Function   Level of Independence Needs assistance with homemaking;Needs assistance with ADLs      Cognition   Overall  Cognitive Status Within Functional Limits for tasks assessed      Observation/Other Assessments   Focus on Therapeutic Outcomes (FOTO)  54% to 67 %      ROM / Strength   AROM / PROM / Strength AROM;Strength                      Objective measurements completed on examination: See above findings.               PT Education - 01/14/20 1308    Education Details POC. FOTO reviewed with score and possible progress, diaphramatic breathig    Person(s) Educated Patient    Methods Explanation;Tactile cues;Verbal cues;Handout    Comprehension Verbalized understanding;Returned demonstration            PT Short Term Goals - 01/14/20 1320      PT SHORT TERM GOAL #1   Title She will be independent with initial HEp    Time 3    Period Weeks    Status New  PT Long Term Goals - 01/14/20 1320      PT LONG TERM GOAL #1   Title She will be independent with all hEp issued    Time 6    Period Weeks    Status New      PT LONG TERM GOAL #2   Title Walking distance goal established after testing nest visit    Time 6    Period Weeks    Status New      PT LONG TERM GOAL #3   Title She will return to normal home activity without breaks.    Time 6    Period Weeks    Status New      PT LONG TERM GOAL #4   Title FOTO score to improve to 67%    Time 6    Period Weeks    Status New                  Plan - 01/14/20 1315    Clinical Impression Statement Nicole Larson presents with report of Covid infection in early September this year and continues to have SOB with activity and has not returned to normal activity. Her strength and mobility was normal but she was late for eval so was not able to do any endurace testing.  At rest her O2 was 99 and HR 107    With skilled PT we hope to progress pt endurance and activity tolerance    Personal Factors and Comorbidities Time since onset of injury/illness/exacerbation    Examination-Activity Limitations  Stairs;Locomotion Level    Examination-Participation Restrictions Occupation;Shop;Community Activity    Stability/Clinical Decision Making Stable/Uncomplicated    Clinical Decision Making Low    Rehab Potential Good    PT Frequency 2x / week    PT Duration 6 weeks    PT Treatment/Interventions Patient/family education;Gait training;Stair training;Functional mobility training;Therapeutic exercise;Therapeutic activities    PT Next Visit Plan review breathing . 6 min walk test.  sit to standx 5, stairs, nustep    PT Home Exercise Plan diaphramatic breathing    Consulted and Agree with Plan of Care Patient           Patient will benefit from skilled therapeutic intervention in order to improve the following deficits and impairments:  Decreased activity tolerance, Decreased endurance  Visit Diagnosis: Physical deconditioning  History of COVID-19     Problem List Patient Active Problem List   Diagnosis Date Noted  . Upper airway cough syndrome 12/27/2019  . Uncomplicated asthma 32/54/9826  . History of COVID-19 11/26/2019  . Cough 11/26/2019  . Anxiety 05/08/2019  . Overweight (BMI 25.0-29.9) 08/25/2016  . Essential hypertension 08/24/2016    Darrel Hoover  PT 01/14/2020, 1:27 PM  [facility] [address] [place], [place], [address] Phone: [phone]   Fax:  [phone]  Name: Nicole Larson MRN: [id number] Date of Birth: 06/09/1975 PHYSICAL THERAPY DISCHARGE SUMMARY  Visits from Start of Care: 1  Current functional level related to goals / functional outcomes: She no showed 2 appointments and after the second when contacted she reported it was not a good time and would call back in future for PT.   Remaining deficits: Unknown as she did not return   Education / Equipment: NA Plan: Patient agrees to discharge.  Patient goals were not met. Patient is being discharged due to the patient's request.  ?????     Pearson Forster PT     02/13/20

## 2020-01-20 ENCOUNTER — Ambulatory Visit: Payer: 59

## 2020-01-29 ENCOUNTER — Ambulatory Visit: Payer: 59 | Admitting: Physical Therapy

## 2020-01-31 ENCOUNTER — Ambulatory Visit: Payer: 59 | Admitting: Physical Therapy

## 2020-02-04 ENCOUNTER — Telehealth: Payer: Self-pay | Admitting: Physical Therapy

## 2020-02-04 ENCOUNTER — Ambulatory Visit: Payer: 59 | Attending: Nurse Practitioner | Admitting: Physical Therapy

## 2020-02-04 NOTE — Telephone Encounter (Signed)
Spoke to patient regarding no show to appointment today. She stated that it was not a good day and she was not feeling well. Reminded her of her next appointment date/times.

## 2020-02-06 ENCOUNTER — Ambulatory Visit: Payer: 59 | Admitting: Physical Therapy

## 2020-02-10 ENCOUNTER — Ambulatory Visit: Payer: 59

## 2020-02-12 ENCOUNTER — Telehealth: Payer: Self-pay | Admitting: Physical Therapy

## 2020-02-12 ENCOUNTER — Ambulatory Visit: Payer: 59 | Admitting: Physical Therapy

## 2020-02-12 NOTE — Telephone Encounter (Signed)
Spoke to patient regarding no-show to appointments x 2. She states that she is not able to attend due to losing her job and stress. She would like to cancel her appointments and she will call back in the future if she can return.

## 2020-02-17 ENCOUNTER — Other Ambulatory Visit: Payer: Self-pay

## 2020-02-17 ENCOUNTER — Encounter: Payer: Self-pay | Admitting: Obstetrics & Gynecology

## 2020-02-17 ENCOUNTER — Ambulatory Visit (INDEPENDENT_AMBULATORY_CARE_PROVIDER_SITE_OTHER): Payer: 59 | Admitting: Obstetrics & Gynecology

## 2020-02-17 VITALS — BP 141/91 | HR 95 | Ht 63.0 in | Wt 168.0 lb

## 2020-02-17 DIAGNOSIS — N644 Mastodynia: Secondary | ICD-10-CM | POA: Diagnosis not present

## 2020-02-17 DIAGNOSIS — N6489 Other specified disorders of breast: Secondary | ICD-10-CM | POA: Diagnosis not present

## 2020-02-17 NOTE — Progress Notes (Signed)
Patient states she is having pain in left breast. Patient also states that one breast is bigger than the other. Kathrene Alu RN

## 2020-02-17 NOTE — Progress Notes (Signed)
History:  44 y.o. [id number] here today for breast asymmetry and pain. Pt reports that for >2 years she has noted breast asymmetry. She reports that prior to 2 weeks prev she did not examine her breasts or pay attention to her body so she does not know if these sx proceeded 2 years ago. She reports that she has upper chest pain on the left side from pulling and pain in her sides where her bra sits. She typically wears Ione. She has never felt a lump or had nipple discharge.       The following portions of the patient's history were reviewed and updated as appropriate: allergies, current medications, past family history, past medical history, past social history, past surgical history and problem list.  Review of Systems:  Pertinent items are noted in HPI.    Objective:  Physical Exam Blood pressure (!) 141/91, pulse 95, height 5\' 3"  (1.6 m), weight 168 lb (76.2 kg).  CONSTITUTIONAL: Well-developed, well-nourished female in no acute distress.  HENT:  Normocephalic, atraumatic EYES: Conjunctivae and EOM are normal. No scleral icterus.  NECK: Normal range of motion SKIN: Skin is warm and dry. No rash noted. Not diaphoretic.No pallor. [place]: Alert and oriented to person, place, and time. Normal coordination.  Breast: the right breast is larger than the left. This is to extreme at all. There is no nipple discharge or masses. There are no skin changes and no axillary LA.   Labs and Imaging 08/09/2019 CLINICAL DATA:  Screening.  EXAM: DIGITAL SCREENING BILATERAL MAMMOGRAM WITH TOMO AND CAD  COMPARISON:  Previous exam(s).  ACR Breast Density Category c: The breast tissue is heterogeneously dense, which may obscure small masses.  FINDINGS: There are no findings suspicious for malignancy. Images were processed with CAD.  IMPRESSION: No mammographic evidence of malignancy. A result letter of this screening mammogram will be mailed directly to the  patient.  RECOMMENDATION: Screening mammogram in one year. (Code:SM-B-01Y)  BI-RADS CATEGORY  1: Negative.   Assessment & Plan:  Breast asymmetry- reviewed with pt that this is a normal finding. Her fears were allayed. I believe that her pain is from a poorly fitting bra. I went over the proper fitting. I also reviewed the size of her breast and broached the subject of a breast reduction which she is not interested in.   She will get fitted for a bra and f/u in 3 months or sooner prn  Total face-to-face time with patient was 20 min.  Greater than 50% was spent in counseling and coordination of care with the patient.   Demani Mcbrien L. Harraway-Smith, M.D., [name]

## 2020-02-18 ENCOUNTER — Encounter: Payer: 59 | Admitting: Physical Therapy

## 2020-03-02 ENCOUNTER — Other Ambulatory Visit: Payer: Self-pay | Admitting: Family

## 2020-03-02 ENCOUNTER — Telehealth: Payer: Self-pay | Admitting: Family

## 2020-03-02 NOTE — Telephone Encounter (Signed)
Patient is following up on med request

## 2020-03-02 NOTE — Telephone Encounter (Signed)
Patient advised 30 days sent for each medication, she will call  Back to set up appointment

## 2020-04-08 ENCOUNTER — Ambulatory Visit: Payer: 59 | Admitting: Family

## 2020-04-08 ENCOUNTER — Telehealth: Payer: Self-pay | Admitting: Family

## 2020-04-08 MED ORDER — LISINOPRIL 10 MG PO TABS: 10.0000 mg | ORAL_TABLET | Freq: Every day | ORAL | 0 refills | Status: DC

## 2020-04-08 NOTE — Telephone Encounter (Signed)
Patient states she is completely out of her blood pressure medicine. She had an appt today that had to be rescheduled due to [facility] having a meeting. Appt was schedule for 04/13/20. Patient is wondering if she could get enough medicine till that appt.  lisinopril (ZESTRIL) 10 MG tablet [[id number]]   [facility] [address] [place], [place]. AT [place]  [address] [place] [address]  Phone:  [phone] Fax:  [phone]

## 2020-04-08 NOTE — Telephone Encounter (Signed)
30 day supply sent to [facility].

## 2020-04-13 ENCOUNTER — Ambulatory Visit: Payer: 59 | Admitting: Family

## 2020-04-13 ENCOUNTER — Other Ambulatory Visit: Payer: Self-pay

## 2020-04-13 ENCOUNTER — Encounter: Payer: Self-pay | Admitting: Family

## 2020-04-13 ENCOUNTER — Ambulatory Visit (INDEPENDENT_AMBULATORY_CARE_PROVIDER_SITE_OTHER): Payer: 59 | Admitting: Family

## 2020-04-13 ENCOUNTER — Other Ambulatory Visit: Payer: Self-pay | Admitting: Family

## 2020-04-13 VITALS — BP 131/80 | HR 91 | Temp 98.3°F | Resp 18 | Wt 166.2 lb

## 2020-04-13 DIAGNOSIS — F419 Anxiety disorder, unspecified: Secondary | ICD-10-CM | POA: Diagnosis not present

## 2020-04-13 DIAGNOSIS — I10 Essential (primary) hypertension: Secondary | ICD-10-CM

## 2020-04-13 DIAGNOSIS — U099 Post covid-19 condition, unspecified: Secondary | ICD-10-CM

## 2020-04-13 MED ORDER — BUSPIRONE HCL 15 MG PO TABS: 15.0000 mg | ORAL_TABLET | Freq: Two times a day (BID) | ORAL | 1 refills | Status: DC

## 2020-04-13 MED ORDER — METOPROLOL SUCCINATE ER 50 MG PO TB24
50.0000 mg | ORAL_TABLET | Freq: Every day | ORAL | 1 refills | Status: DC
Start: 2020-04-13 — End: 2020-09-18

## 2020-04-13 MED ORDER — METOPROLOL SUCCINATE ER 50 MG PO TB24: 50.0000 mg | ORAL_TABLET | Freq: Every day | ORAL | 1 refills | Status: DC

## 2020-04-13 NOTE — Progress Notes (Signed)
Subjective:    Patient ID: Nicole Larson, female    DOB: [birth year], 45 y.o.   MRN: [id number]  HPI  Patient is a 45 yr old female who presents today for follow up.  HTN- since our last visit, her pulmonologist, stopped lisinopril and had her start valsartan.  She felt as though her cough was worsened after she started the valsartan and she also noted that she developed headache on losartan.  She switched back to lisinopril and notes that she continues to cough but BP is well controlled.   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 States she does not know if this is COVID related or if it is due to her bp medication.  Anxiety- notes improvement on Buspar.  She reports that she plans to take a trip to her mother's gravesite on Mother's day. She has not been in "years" because of fear of driving on the highway. However, now that she is on Buspar, she feels that she is ready to handle the highway.   Review of Systems See HPI  Past Medical History:  Diagnosis Date  . Asthma   . Hypertension   . Pneumonia      Social History   Socioeconomic History  . Marital status: Divorced    Spouse name: Not on file  . Number of children: Not on file  . Years of education: Not on file  . Highest education level: Not on file  Occupational History  . Not on file  Tobacco Use  . Smoking status: Never Smoker  . Smokeless tobacco: Never Used  Vaping Use  . Vaping Use: Never used  Substance and Sexual Activity  . Alcohol use: Yes    Comment: occasionally throughout year  . Drug use: No  . Sexual activity: Not Currently    Birth control/protection: Surgical  Other Topics Concern  . Not on file  Social History Narrative   Works as a Quarry manager at [employer]   She will return to school at [employer] for nursing   4  children (1 daughter and 3 sons) Her 3 sons live at home   Has one granddaughter age 55   In process of divorce   Enjoys spending time with her children, shopping, netflix, going out, vacation   Social Determinants of Health   Financial Resource Strain: Not on file  Food Insecurity: Not on file  Transportation Needs: Not on file  Physical Activity: Not on file  Stress: Not on file  Social Connections: Not on file  Intimate Partner Violence: Not on file    Past Surgical History:  Procedure Laterality Date  . TUBAL LIGATION      Family History  Problem Relation Age of Onset  . Sickle cell anemia Father   . Diabetes Maternal Grandmother   . Hypertension Maternal Grandmother   . Diabetes Maternal Grandfather   . Hypertension Maternal Grandfather   . Diabetes Paternal Grandmother   . Diabetes Paternal Grandfather     No Known Allergies  Current Outpatient Medications on File Prior to Visit  Medication Sig Dispense Refill  . amLODipine (NORVASC) 10 MG tablet Take 1 tablet (10 mg total) by mouth daily. 30 tablet 0  . Biotin 1000 MCG tablet Take 1,000 mcg by mouth daily.    . busPIRone (BUSPAR) 15 MG  tablet Take 1 tablet (15 mg total) by mouth 2 (two) times daily. 60 tablet 0  . lisinopril (ZESTRIL) 10 MG tablet Take 1 tablet (10 mg total) by mouth daily. 30 tablet 0  . Multiple Vitamin (MULTIVITAMIN) tablet Take 1 tablet by mouth daily.    Marland Kitchen OVER THE COUNTER MEDICATION BLACKSEED OIL.  Take 2 teaspoons a day with honey.    . valsartan (DIOVAN) 160 MG tablet Take 1 tablet (160 mg total) by mouth daily. 30 tablet 11  . albuterol (VENTOLIN HFA) 108 (90 Base) MCG/ACT inhaler Inhale 2 puffs into the lungs every 6 (six) hours as needed for wheezing or shortness of breath. (Patient not taking: Reported on 04/13/2020) 6.7 g 5  . budesonide-formoterol (SYMBICORT) 80-4.5 MCG/ACT inhaler Inhale 2 puffs into the lungs 2 (two) times daily. (Patient not taking: Reported on 04/13/2020) 1 each 3  .  diclofenac (CATAFLAM) 50 MG tablet Take 1 tablet (50 mg total) by mouth 3 (three) times daily. (Patient not taking: Reported on 04/13/2020) 30 tablet 2  . famotidine (PEPCID) 20 MG tablet Take 1 tablet (20 mg total) by mouth 2 (two) times daily. 60 tablet 0  . montelukast (SINGULAIR) 10 MG tablet Take 1 tablet (10 mg total) by mouth at bedtime. (Patient not taking: Reported on 04/13/2020) 30 tablet 3  . pantoprazole (PROTONIX) 40 MG tablet Take 1 tablet (40 mg total) by mouth daily. Take 30-60 min before first meal of the day (Patient not taking: Reported on 04/13/2020) 30 tablet 2  . promethazine-dextromethorphan (PROMETHAZINE-DM) 6.25-15 MG/5ML syrup Take 5 mLs by mouth 4 (four) times daily as needed for cough. (Patient not taking: Reported on 04/13/2020) 200 mL 1   No current facility-administered medications on file prior to visit.    BP 131/80 (BP Location: Right Arm, Patient Position: Sitting, Cuff Size: Normal)   Pulse 91   Temp 98.3 F (36.8 C) (Oral)   Resp 18   Wt 166 lb 3.2 oz (75.4 kg)   SpO2 100%   BMI 29.44 kg/m       Objective:   Physical Exam Constitutional:      Appearance: She is well-developed and well-nourished.  Cardiovascular:     Rate and Rhythm: Normal rate and regular rhythm.     Heart sounds: Normal heart sounds. No murmur heard.   Pulmonary:     Effort: Pulmonary effort is normal. No respiratory distress.     Breath sounds: Normal breath sounds. No wheezing.  Psychiatric:        Mood and Affect: Mood and affect normal.        Behavior: Behavior normal.        Thought Content: Thought content normal.        Judgment: Judgment normal.           Assessment & Plan:  HTN- due to c/o ongoing cough will d/c lisinopril. Remain off of valsartan due to HA.  She had hypokalemia on hctz and did not like the K+ supplements.  Will give a trial of Toprol xl 50mg  in addition to her amlodipine 10mg  once daily.  COVID-19 long hauler syndrome- much improved. We  will see how she does off of lisinopril in terms of cough.  Anxiety- stable/improved. Continue buspar 7.5mg  bid.  This visit occurred during the SARS-CoV-2 public health emergency.  Safety protocols were in place, including screening questions prior to the visit, additional usage of staff PPE, and extensive cleaning of exam room while observing appropriate contact time as  indicated for disinfecting solutions.

## 2020-05-18 ENCOUNTER — Other Ambulatory Visit: Payer: Self-pay

## 2020-05-18 ENCOUNTER — Ambulatory Visit (INDEPENDENT_AMBULATORY_CARE_PROVIDER_SITE_OTHER): Payer: 59 | Admitting: Obstetrics & Gynecology

## 2020-05-18 ENCOUNTER — Ambulatory Visit (INDEPENDENT_AMBULATORY_CARE_PROVIDER_SITE_OTHER): Payer: 59 | Admitting: Family

## 2020-05-18 ENCOUNTER — Encounter: Payer: Self-pay | Admitting: Obstetrics & Gynecology

## 2020-05-18 ENCOUNTER — Encounter: Payer: Self-pay | Admitting: Family

## 2020-05-18 VITALS — BP 125/85 | HR 99 | Temp 98.7°F | Resp 16 | Ht 63.0 in | Wt 172.0 lb

## 2020-05-18 VITALS — BP 134/90 | HR 83 | Wt 171.0 lb

## 2020-05-18 DIAGNOSIS — Z0000 Encounter for general adult medical examination without abnormal findings: Secondary | ICD-10-CM | POA: Diagnosis not present

## 2020-05-18 DIAGNOSIS — E669 Obesity, unspecified: Secondary | ICD-10-CM

## 2020-05-18 DIAGNOSIS — I10 Essential (primary) hypertension: Secondary | ICD-10-CM | POA: Diagnosis not present

## 2020-05-18 DIAGNOSIS — D649 Anemia, unspecified: Secondary | ICD-10-CM

## 2020-05-18 DIAGNOSIS — F419 Anxiety disorder, unspecified: Secondary | ICD-10-CM | POA: Diagnosis not present

## 2020-05-18 DIAGNOSIS — Z23 Encounter for immunization: Secondary | ICD-10-CM

## 2020-05-18 DIAGNOSIS — N6489 Other specified disorders of breast: Secondary | ICD-10-CM | POA: Diagnosis not present

## 2020-05-18 LAB — CBC WITH DIFFERENTIAL/PLATELET
Basophils Absolute: 0 10*3/uL (ref 0.0–0.1)
Basophils Relative: 0.6 % (ref 0.0–3.0)
Eosinophils Absolute: 0.1 10*3/uL (ref 0.0–0.7)
Eosinophils Relative: 1.4 % (ref 0.0–5.0)
HCT: 37 % (ref 36.0–46.0)
Hemoglobin: 12.8 g/dL (ref 12.0–15.0)
Lymphocytes Relative: 40 % (ref 12.0–46.0)
Lymphs Abs: 2.8 10*3/uL (ref 0.7–4.0)
MCHC: 34.5 g/dL (ref 30.0–36.0)
MCV: 80.2 fl (ref 78.0–100.0)
Monocytes Absolute: 0.4 10*3/uL (ref 0.1–1.0)
Monocytes Relative: 5.2 % (ref 3.0–12.0)
Neutro Abs: 3.7 10*3/uL (ref 1.4–7.7)
Neutrophils Relative %: 52.8 % (ref 43.0–77.0)
Platelets: 298 10*3/uL (ref 150.0–400.0)
RBC: 4.61 Mil/uL (ref 3.87–5.11)
RDW: 15.1 % (ref 11.5–15.5)
WBC: 6.9 10*3/uL (ref 4.0–10.5)

## 2020-05-18 LAB — COMPREHENSIVE METABOLIC PANEL
ALT: 20 U/L (ref 0–35)
AST: 16 U/L (ref 0–37)
Albumin: 4.4 g/dL (ref 3.5–5.2)
Alkaline Phosphatase: 48 U/L (ref 39–117)
BUN: 14 mg/dL (ref 6–23)
CO2: 31 mEq/L (ref 19–32)
Calcium: 9.5 mg/dL (ref 8.4–10.5)
Chloride: 102 mEq/L (ref 96–112)
Creatinine, Ser: 1.04 mg/dL (ref 0.40–1.20)
GFR: 66.39 mL/min (ref >60.00)
Glucose, Bld: 100 mg/dL — ABNORMAL HIGH (ref 70–99)
Potassium: 3.9 mEq/L (ref 3.5–5.1)
Sodium: 140 mEq/L (ref 135–145)
Total Bilirubin: 0.4 mg/dL (ref 0.2–1.2)
Total Protein: 7.2 g/dL (ref 6.0–8.3)

## 2020-05-18 LAB — LIPID PANEL
Cholesterol: 173 mg/dL (ref 0–200)
HDL: 54.5 mg/dL (ref >39.00)
LDL Cholesterol: 107 mg/dL — ABNORMAL HIGH (ref 0–99)
NonHDL: 118.39
Total CHOL/HDL Ratio: 3
Triglycerides: 55 mg/dL (ref 0.0–149.0)
VLDL: 11 mg/dL (ref 0.0–40.0)

## 2020-05-18 NOTE — Patient Instructions (Signed)
Please check blood pressure at work on 3 different days and then send me your readings via mychart.

## 2020-05-18 NOTE — Progress Notes (Signed)
Subjective:    Patient ID: Nicole Larson, female    DOB: 1975/10/24, 45 y.o.   MRN: [id number]  HPI  Patient presents today for complete physical.  Immunizations: pfizer x 2- too early for booster. Had flu shot Diet: trying to eat healthier and bring food with her to work.  She drinks regular sodas- bottle or can every or can, she does an AM smoothies. No sweet tea.  Exercise: plans to start exercising Wt Readings from Last 3 Encounters:  05/18/20 172 lb (78 kg)  05/18/20 171 lb (77.6 kg)  04/13/20 166 lb 3.2 oz (75.4 kg)  Pap Smear: 10/02/19 Mammogram: 08/09/19 Vision:  Returns in October.   Dental: due- she is waiting on dental insurance  HTN- last visit we gave her a trial of toprol xl 50mg  in addition to amlodipine 10mg  once daily. Last visit we discontinued lisinopril due to cough.  She was continued on amlodipine 10mg . She repo  BP Readings from Last 3 Encounters:  05/18/20 125/85  05/18/20 134/90  04/13/20 131/80    Review of Systems  Constitutional: Positive for unexpected weight change.  HENT: Negative for hearing loss and rhinorrhea.   Eyes: Negative for visual disturbance.  Respiratory: Negative for cough and shortness of breath.   Cardiovascular: Negative for chest pain.  Gastrointestinal: Negative for constipation and diarrhea.  Genitourinary: Negative for dysuria, frequency and menstrual problem.  Musculoskeletal: Negative for arthralgias and myalgias.  Skin: Negative for rash.  Neurological: Negative for headaches.  Hematological: Negative for adenopathy.  Psychiatric/Behavioral:       Mild depression symptoms.     Past Medical History:  Diagnosis Date  . Asthma   . Hypertension   . Pneumonia      Social History   Socioeconomic History  . Marital status: Divorced    Spouse name: Not on file  . Number of children: Not on file  . Years of education: Not on file  . Highest education level: Not on file  Occupational History  . Not on file   Tobacco Use  . Smoking status: Never Smoker  . Smokeless tobacco: Never Used  Vaping Use  . Vaping Use: Never used  Substance and Sexual Activity  . Alcohol use: Yes    Comment: occasionally throughout year  . Drug use: No  . Sexual activity: Not Currently    Birth control/protection: Surgical  Other Topics Concern  . Not on file  Social History Narrative   Works as a Quarry manager at an agency   4 children (1 daughter and 3 sons) Her 3 sons live at home   Has one granddaughter   54 Divorced   Enjoys spending time with her children, shopping, netflix, going out, vacation    Social Determinants of Radio broadcast assistant Strain: Not on file  Food Insecurity: Not on file  Transportation Needs: Not on file  Physical Activity: Not on file  Stress: Not on file  Social Connections: Not on file  Intimate Partner Violence: Not on file    Past Surgical History:  Procedure Laterality Date  . TUBAL LIGATION      Family History  Problem Relation Age of Onset  . Sickle cell anemia Father   . Diabetes Maternal Grandmother   . Hypertension Maternal Grandmother   . Diabetes Maternal Grandfather   . Hypertension Maternal Grandfather   . Diabetes Paternal Grandmother   . Diabetes Paternal Grandfather     Allergies  Allergen Reactions  . Lisinopril  cough    Current Outpatient Medications on File Prior to Visit  Medication Sig Dispense Refill  . amLODipine (NORVASC) 10 MG tablet Take 1 tablet (10 mg total) by mouth daily. 30 tablet 1  . Biotin 1000 MCG tablet Take 1,000 mcg by mouth daily.    . busPIRone (BUSPAR) 15 MG tablet Take 1 tablet (15 mg total) by mouth 2 (two) times daily. 90 tablet 1  . metoprolol succinate (TOPROL-XL) 50 MG 24 hr tablet Take 1 tablet (50 mg total) by mouth daily. Take with or immediately following a meal. 90 tablet 1  . Multiple Vitamin (MULTIVITAMIN) tablet Take 1 tablet by mouth daily.    Marland Kitchen OVER THE COUNTER MEDICATION BLACKSEED OIL.  Take 2  teaspoons a day with honey.     No current facility-administered medications on file prior to visit.    BP 125/85   Pulse 99   Temp 98.7 F (37.1 C) (Oral)   Resp 16   Ht 5\' 3"  (1.6 m)   Wt 172 lb (78 kg)   LMP 05/13/2020   SpO2 99%   BMI 30.47 kg/m       Objective:   Physical Exam  Physical Exam  Constitutional: She is oriented to person, place, and time. She appears well-developed and well-nourished. No distress.  HENT:  Head: Normocephalic and atraumatic.  Right Ear: Tympanic membrane and ear canal normal.  Left Ear: Tympanic membrane and ear canal normal.  Mouth/Throat: Not examined- pt wearing mask Eyes: Pupils are equal, round, and reactive to light. No scleral icterus.  Neck: Normal range of motion. No thyromegaly present.  Cardiovascular: Normal rate and regular rhythm.   No murmur heard. Pulmonary/Chest: Effort normal and breath sounds normal. No respiratory distress. He has no wheezes. She has no rales. She exhibits no tenderness.  Abdominal: Soft. Bowel sounds are normal. She exhibits no distension and no mass. There is no tenderness. There is no rebound and no guarding.  Musculoskeletal: She exhibits no edema.  Lymphadenopathy:    She has no cervical adenopathy.  Neurological: She is alert and oriented to person, place, and time. She has normal patellar reflexes. She exhibits normal muscle tone. Coordination normal.  Skin: Skin is warm and dry.  Psychiatric: She has a normal mood and affect. Her behavior is normal. Judgment and thought content normal.  Breasts: Examined lying Right: Without masses, retractions, discharge or axillary adenopathy.  Left: Without masses, retractions, discharge or axillary adenopathy.  Pelvic: deferred       Assessment & Plan:  Preventative care- discussed healthy diet, exercise and weight loss. Pap up to date.  We don't have record of tetanus on file- not given today- will need to give next visit.   HTN- initial bp was quite  hight but follow up bp was OK. Continue toprol xl 50 mg and amlodipine 10 mg. Cough is better off of lisinopril. Pt is advised to please check blood pressure at work on 3 different days and then send me your readings via mychart.  Anxiety- stable on buspar 15mg  bid. Continue same.   Labs as ordered.  This visit occurred during the SARS-CoV-2 public health emergency.  Safety protocols were in place, including screening questions prior to the visit, additional usage of staff PPE, and extensive cleaning of exam room while observing appropriate contact time as indicated for disinfecting solutions.       Assessment & Plan:

## 2020-05-18 NOTE — Addendum Note (Signed)
Addended by: Jiles Prows on: 05/18/2020 02:29 PM   Modules accepted: Orders

## 2020-05-18 NOTE — Progress Notes (Signed)
History:  45 y.o. [id number] here today for f/u of breast asymmetry. Pt reports that she has gotten fitted for and purchased new bras. The pain has resolved. She reports that when she wears the sports bra, she is completely comfortable. She still reports being self conscience about her asymmetric breasts.    The following portions of the patient's history were reviewed and updated as appropriate: allergies, current medications, past family history, past medical history, past social history, past surgical history and problem list.  Review of Systems:  Pertinent items are noted in HPI.    Objective:  Physical Exam Weight 171 lb (77.6 kg), last menstrual period 05/13/2020.  CONSTITUTIONAL: Well-developed, well-nourished female in no acute distress.  HENT:  Normocephalic, atraumatic EYES: Conjunctivae and EOM are normal. No scleral icterus.  NECK: Normal range of motion SKIN: Skin is warm and dry. No rash noted. Not diaphoretic.No pallor. [place]: Alert and oriented to person, place, and time. Normal coordination.     Assessment & Plan:  Nicole was seen today for follow-up.  Diagnoses and all orders for this visit:  Breast asymmetry  f/u in 07/2020 for screening mammogram  F/u in 09/2020 for annual  F/u sooner prn  Nicole Larson, M.D., [name]

## 2020-06-22 ENCOUNTER — Other Ambulatory Visit: Payer: Self-pay

## 2020-06-22 ENCOUNTER — Encounter: Payer: Self-pay | Admitting: Family

## 2020-06-22 ENCOUNTER — Ambulatory Visit (INDEPENDENT_AMBULATORY_CARE_PROVIDER_SITE_OTHER): Payer: 59 | Admitting: Family

## 2020-06-22 VITALS — BP 132/90 | HR 67 | Temp 98.9°F | Resp 16 | Ht 63.0 in | Wt 172.0 lb

## 2020-06-22 DIAGNOSIS — M79622 Pain in left upper arm: Secondary | ICD-10-CM | POA: Insufficient documentation

## 2020-06-22 NOTE — Patient Instructions (Signed)
Please call if increased pain or if you develop rash. You may use tylenol as needed.

## 2020-06-22 NOTE — Progress Notes (Signed)
Subjective:   By signing my name below, I, Shehryar Baig, attest that this documentation has been prepared under the direction and in the presence of [name], NP. 06/22/2020     Patient ID: Nicole Larson, female    DOB: Mar 26, 1975, 45 y.o.   MRN: [id number]  No chief complaint on file.   HPI Patient is in today for a office visit. She is complaining of tight pain that wax and wanes in her left axillary region for the past 3 days. She notes that her pain persists even after removing her bra.She denies having any swollen glands in her left axilla. She denies fever or rash.   Past Medical History:  Diagnosis Date  . Asthma   . Hypertension   . Pneumonia     Past Surgical History:  Procedure Laterality Date  . TUBAL LIGATION      Family History  Problem Relation Age of Onset  . Sickle cell anemia Father   . Diabetes Maternal Grandmother   . Hypertension Maternal Grandmother   . Diabetes Maternal Grandfather   . Hypertension Maternal Grandfather   . Diabetes Paternal Grandmother   . Diabetes Paternal Grandfather     Social History   Socioeconomic History  . Marital status: Divorced    Spouse name: Not on file  . Number of children: Not on file  . Years of education: Not on file  . Highest education level: Not on file  Occupational History  . Not on file  Tobacco Use  . Smoking status: Never Smoker  . Smokeless tobacco: Never Used  Vaping Use  . Vaping Use: Never used  Substance and Sexual Activity  . Alcohol use: Yes    Comment: occasionally throughout year  . Drug use: No  . Sexual activity: Not Currently    Birth control/protection: Surgical  Other Topics Concern  . Not on file  Social History Narrative   Works as a Quarry manager at an agency   4 children (1 daughter and 3 sons) Her 3 sons live at home   Has one granddaughter   72 Divorced   Enjoys spending time with her children, shopping, netflix, going out, vacation    Social Determinants  of Radio broadcast assistant Strain: Not on file  Food Insecurity: Not on file  Transportation Needs: Not on file  Physical Activity: Not on file  Stress: Not on file  Social Connections: Not on file  Intimate Partner Violence: Not on file    Outpatient Medications Prior to Visit  Medication Sig Dispense Refill  . amLODipine (NORVASC) 10 MG tablet Take 1 tablet (10 mg total) by mouth daily. 30 tablet 1  . Biotin 1000 MCG tablet Take 1,000 mcg by mouth daily.    . busPIRone (BUSPAR) 15 MG tablet Take 1 tablet (15 mg total) by mouth 2 (two) times daily. 90 tablet 1  . metoprolol succinate (TOPROL-XL) 50 MG 24 hr tablet Take 1 tablet (50 mg total) by mouth daily. Take with or immediately following a meal. 90 tablet 1  . Multiple Vitamin (MULTIVITAMIN) tablet Take 1 tablet by mouth daily.    Marland Kitchen OVER THE COUNTER MEDICATION BLACKSEED OIL.  Take 2 teaspoons a day with honey.     No facility-administered medications prior to visit.    Allergies  Allergen Reactions  . Lisinopril     cough    Review of Systems  Skin:       (+)Pain in left axillary  Objective:    Physical Exam Exam conducted with a chaperone present.  Constitutional:      Appearance: She is well-developed.  Neck:     Thyroid: No thyromegaly.  Cardiovascular:     Rate and Rhythm: Normal rate and regular rhythm.     Pulses: Normal pulses.     Heart sounds: Normal heart sounds. No murmur heard.   Pulmonary:     Effort: Pulmonary effort is normal. No respiratory distress.     Breath sounds: Normal breath sounds. No wheezing.  Chest:  Breasts:     Right: Normal. No mass, axillary adenopathy or supraclavicular adenopathy.     Left: Normal. No mass, axillary adenopathy or supraclavicular adenopathy.    Musculoskeletal:     Cervical back: Neck supple.  Lymphadenopathy:     Cervical: No cervical adenopathy.     Upper Body:     Right upper body: No supraclavicular or axillary adenopathy.     Left  upper body: No supraclavicular or axillary adenopathy.  Skin:    General: Skin is warm and dry.  Neurological:     Mental Status: She is alert and oriented to person, place, and time.  Psychiatric:        Behavior: Behavior normal.        Thought Content: Thought content normal.        Judgment: Judgment normal.     There were no vitals taken for this visit. Wt Readings from Last 3 Encounters:  05/18/20 172 lb (78 kg)  05/18/20 171 lb (77.6 kg)  04/13/20 166 lb 3.2 oz (75.4 kg)    Diabetic Foot Exam - Simple   No data filed    Lab Results  Component Value Date   WBC 6.9 05/18/2020   HGB 12.8 05/18/2020   HCT 37.0 05/18/2020   PLT 298.0 05/18/2020   GLUCOSE 100 (H) 05/18/2020   CHOL 173 05/18/2020   TRIG 55.0 05/18/2020   HDL 54.50 05/18/2020   LDLCALC 107 (H) 05/18/2020   ALT 20 05/18/2020   AST 16 05/18/2020   NA 140 05/18/2020   K 3.9 05/18/2020   CL 102 05/18/2020   CREATININE 1.04 05/18/2020   BUN 14 05/18/2020   CO2 31 05/18/2020   HGBA1C 5.1 04/10/2019    No results found for: TSH Lab Results  Component Value Date   WBC 6.9 05/18/2020   HGB 12.8 05/18/2020   HCT 37.0 05/18/2020   MCV 80.2 05/18/2020   PLT 298.0 05/18/2020   Lab Results  Component Value Date   NA 140 05/18/2020   K 3.9 05/18/2020   CO2 31 05/18/2020   GLUCOSE 100 (H) 05/18/2020   BUN 14 05/18/2020   CREATININE 1.04 05/18/2020   BILITOT 0.4 05/18/2020   ALKPHOS 48 05/18/2020   AST 16 05/18/2020   ALT 20 05/18/2020   PROT 7.2 05/18/2020   ALBUMIN 4.4 05/18/2020   CALCIUM 9.5 05/18/2020   ANIONGAP 10 11/19/2019   GFR 66.39 05/18/2020   Lab Results  Component Value Date   CHOL 173 05/18/2020   Lab Results  Component Value Date   HDL 54.50 05/18/2020   Lab Results  Component Value Date   LDLCALC 107 (H) 05/18/2020   Lab Results  Component Value Date   TRIG 55.0 05/18/2020   Lab Results  Component Value Date   CHOLHDL 3 05/18/2020   Lab Results  Component  Value Date   HGBA1C 5.1 04/10/2019       Assessment &  Plan:   Problem List Items Addressed This Visit   None      No orders of the defined types were placed in this encounter.   I, Shehryar [place], personally preformed the services described in this documentation.  All medical record entries made by the scribe were at my direction and in my presence.  I have reviewed the chart and discharge instructions (if applicable) and agree that the record reflects my personal performance and is accurate and complete. 06/22/2020   I,Shehryar Baig,acting as a Education administrator for Nance Pear, NP.,have documented all relevant documentation on the behalf of Nance Pear, NP,as directed by  Nance Pear, NP while in the presence of Nance Pear, NP.   Shehryar [place], NP, have reviewed all documentation for this visit. The documentation on 06/22/20 for the exam, diagnosis, procedures, and orders are all accurate and complete.

## 2020-06-22 NOTE — Assessment & Plan Note (Signed)
New.  Suspect musculoskeletal etiology. Advised pt to use tylenol prn, call if skin rash develops or if symptoms worsen. Pt verbalizes understanding. She is advised to schedule routine mammogram after 08/08/20.

## 2020-06-23 ENCOUNTER — Other Ambulatory Visit: Payer: Self-pay | Admitting: Family

## 2020-06-30 ENCOUNTER — Encounter: Payer: Self-pay | Admitting: General Practice

## 2020-06-30 ENCOUNTER — Encounter: Payer: Self-pay | Admitting: Family

## 2020-06-30 DIAGNOSIS — R41840 Attention and concentration deficit: Secondary | ICD-10-CM

## 2020-07-08 ENCOUNTER — Other Ambulatory Visit: Payer: Self-pay | Admitting: Family

## 2020-07-08 MED ORDER — BUSPIRONE HCL 15 MG PO TABS: 7.5000 mg | ORAL_TABLET | Freq: Two times a day (BID) | ORAL | 1 refills | Status: DC

## 2020-07-16 ENCOUNTER — Telehealth: Payer: Self-pay

## 2020-07-16 ENCOUNTER — Other Ambulatory Visit: Payer: Self-pay | Admitting: Obstetrics & Gynecology

## 2020-07-16 DIAGNOSIS — N6489 Other specified disorders of breast: Secondary | ICD-10-CM

## 2020-07-16 NOTE — Telephone Encounter (Signed)
Spoke to pt regarding her diagnostic mammogram. Pt made aware that her insurance may not cover it but radiology will let her know. Pt states she scheduled her mammogram and radiology didn't mention anything about her insurance. Avril Busser l Louie Flenner, CMA

## 2020-09-18 ENCOUNTER — Other Ambulatory Visit: Payer: Self-pay | Admitting: Family

## 2020-09-28 ENCOUNTER — Telehealth: Payer: Self-pay | Admitting: Family

## 2020-09-28 ENCOUNTER — Other Ambulatory Visit (HOSPITAL_COMMUNITY)
Admission: RE | Admit: 2020-09-28 | Discharge: 2020-09-28 | Disposition: A | Discharge status: Home / Self Care | Payer: 59 | Source: Ambulatory Visit | Attending: Obstetrics & Gynecology | Admitting: Obstetrics & Gynecology

## 2020-09-28 ENCOUNTER — Encounter: Payer: Self-pay | Admitting: Obstetrics & Gynecology

## 2020-09-28 ENCOUNTER — Ambulatory Visit (INDEPENDENT_AMBULATORY_CARE_PROVIDER_SITE_OTHER): Payer: 59 | Admitting: Obstetrics & Gynecology

## 2020-09-28 ENCOUNTER — Other Ambulatory Visit: Payer: Self-pay

## 2020-09-28 VITALS — BP 154/101 | Ht 62.0 in | Wt 171.0 lb

## 2020-09-28 DIAGNOSIS — Z01419 Encounter for gynecological examination (general) (routine) without abnormal findings: Secondary | ICD-10-CM

## 2020-09-28 DIAGNOSIS — R030 Elevated blood-pressure reading, without diagnosis of hypertension: Secondary | ICD-10-CM

## 2020-09-28 DIAGNOSIS — Z113 Encounter for screening for infections with a predominantly sexual mode of transmission: Secondary | ICD-10-CM | POA: Diagnosis not present

## 2020-09-28 NOTE — Progress Notes (Signed)
Subjective:     Nicole Larson is a 45 y.o. female here for a routine exam.  Current complaints: pain with cycles. Pt has the Rx for Cataflam but, she doesn't like to take meds. She is concerned about her BP. Her meds were adjusted by [name].      Gynecologic History Patient's last menstrual period was 09/24/2020 (exact date). Contraception: tubal ligation Last Pap: 10/02/2019. Results were: normal Last mammogram: 08/09/2019. Results were: normal  Obstetric History OB History  Gravida Para Term Preterm AB Living  '4 4 4     4  '$ SAB IAB Ectopic Multiple Live Births          4    # Outcome Date GA Lbr Len/2nd Weight Sex Delivery Anes PTL Lv  4 Term 2007 [redacted]w[redacted]d  M Vag-Spont EPI N LIV  3 Term 2004 477w0d M Vag-Spont EPI N LIV  2 Term 2001 [id number] Vag-Spont None N LIV  1 Term 1998 [id number] Vag-Spont None N LIV    The following portions of the patient's history were reviewed and updated as appropriate: allergies, current medications, past family history, past medical history, past social history, past surgical history, and problem list.  Review of Systems Pertinent items are noted in HPI.    Objective:  BP (!) 154/101   Ht '5\' 2"'$  (1.575 m)   Wt 171 lb (77.6 kg)   LMP 09/24/2020 (Exact Date)   BMI 31.28 kg/m  General Appearance:    Alert, cooperative, no distress, appears stated age  Head:    Normocephalic, without obvious abnormality, atraumatic  Eyes:    conjunctiva/corneas clear, EOM's intact, both eyes  Ears:    Normal external ear canals, both ears  Nose:   Nares normal, septum midline, mucosa normal, no drainage    or sinus tenderness  Throat:   Lips, mucosa, and tongue normal; teeth and gums normal  Neck:   Supple, symmetrical, trachea midline, no adenopathy;    thyroid:  enlarged. Non tender. Symmetric.   Back:     Symmetric, no curvature, ROM normal, no CVA tenderness  Lungs:     respirations unlabored  Chest Wall:    No tenderness or deformity   Heart:     Regular rate and rhythm  Breast Exam:    No tenderness, masses, or nipple abnormality  Abdomen:     Soft, non-tender, bowel sounds active all four quadrants,    no masses, no organomegaly  Genitalia:    Normal female without lesion, discharge or tenderness     Extremities:   Extremities normal, atraumatic, no cyanosis or edema  Pulses:   2+ and symmetric all extremities  Skin:   Skin color, texture, turgor normal, no rashes or lesions      Assessment:    Healthy female exam.  Enlarged thyroid. Suspect goiter  Uncontrolled chronic HTN   Plan:   Nicole Larson seen today for gynecologic exam.  Diagnoses and all orders for this visit:  Well woman exam with routine gynecological exam -     Cytology - PAP( [facility]) -     HIV antibody (with reflex) -     Thyroid Panel With TSH  Screening for STD (sexually transmitted disease) -     HIV antibody (with reflex)  Elevated blood pressure reading   Pt to make a f/u appt with MeliDebbrah AlarF/u in 1 year for annual   Maven Varelas L. Harraway-Smith, M.D.,  FACOG

## 2020-09-28 NOTE — Progress Notes (Signed)
Patient presents for annual exam.  Patient PCP Earlie Counts- pt has concerns about her BP meds.   Patient offered STD testing and only elected HIV blood test. Kathrene Alu RN

## 2020-09-28 NOTE — Telephone Encounter (Signed)
Please contact pt to schedule a follow up visit with me. GYN was concerned about her blood pressure and thyroid.

## 2020-09-29 LAB — THYROID PANEL WITH TSH
Free Thyroxine Index: 2 (ref 1.2–4.9)
T3 Uptake Ratio: 28 % (ref 24–39)
T4, Total: 7.3 ug/dL (ref 4.5–12.0)
TSH: 0.982 u[IU]/mL (ref 0.450–4.500)

## 2020-09-29 LAB — HIV ANTIBODY (ROUTINE TESTING W REFLEX): HIV Screen 4th Generation wRfx: NONREACTIVE

## 2020-09-30 LAB — CYTOLOGY - PAP
Comment: NEGATIVE
Diagnosis: NEGATIVE
High risk HPV: NEGATIVE

## 2020-10-01 ENCOUNTER — Ambulatory Visit
Admission: RE | Admit: 2020-10-01 | Discharge: 2020-10-01 | Disposition: A | Discharge status: Home / Self Care | Payer: 59 | Source: Ambulatory Visit | Attending: Obstetrics & Gynecology | Admitting: Obstetrics & Gynecology

## 2020-10-01 ENCOUNTER — Other Ambulatory Visit: Payer: Self-pay

## 2020-10-01 DIAGNOSIS — N6489 Other specified disorders of breast: Secondary | ICD-10-CM

## 2020-10-02 ENCOUNTER — Ambulatory Visit (INDEPENDENT_AMBULATORY_CARE_PROVIDER_SITE_OTHER): Payer: 59 | Admitting: Family

## 2020-10-02 VITALS — BP 138/83 | HR 85 | Temp 98.6°F | Resp 16 | Ht 63.0 in | Wt 169.0 lb

## 2020-10-02 DIAGNOSIS — E010 Iodine-deficiency related diffuse (endemic) goiter: Secondary | ICD-10-CM

## 2020-10-02 DIAGNOSIS — Z8616 Personal history of COVID-19: Secondary | ICD-10-CM

## 2020-10-02 DIAGNOSIS — I10 Essential (primary) hypertension: Secondary | ICD-10-CM | POA: Diagnosis not present

## 2020-10-02 DIAGNOSIS — F419 Anxiety disorder, unspecified: Secondary | ICD-10-CM

## 2020-10-02 MED ORDER — BUSPIRONE HCL 15 MG PO TABS: 15.0000 mg | ORAL_TABLET | Freq: Two times a day (BID) | ORAL | 1 refills | Status: DC

## 2020-10-02 NOTE — Assessment & Plan Note (Signed)
Encouraged pt to get the new booster vaccine this fall.

## 2020-10-02 NOTE — Assessment & Plan Note (Signed)
BP Readings from Last 3 Encounters:  10/02/20 138/83  09/28/20 (!) 154/101  06/22/20 132/90   Currently meds include amlodipine '10mg'$  and toprol xl '50mg'$ .

## 2020-10-02 NOTE — Progress Notes (Signed)
Subjective:   By signing my name below, I, Shehryar Baig, attest that this documentation has been prepared under the direction and in the presence of [name] NP. 10/02/2020    Patient ID: Nicole Larson, female    DOB: 05-13-1975, 45 y.o.   MRN: [id number]  Chief Complaint  Patient presents with   Hypertension    Complains of BP going up    HPI Patient is in today for a office visit. She reports she recently recovered from Covid-19 and returned to work.  Blood pressure- She measures her blood pressure regularly home and reports its elevated. When taking her blood pressure medicine at night she typically measured 180/112 in the morning and 142/103 after work. She then switched to taking her blood pressure medication in the morning and typically gets measurements of 137/88. She continues taking 50 mg metoprolol succinate daily PO and 10 mg amlodipine daily PO.  BP Readings from Last 3 Encounters:  10/02/20 138/83  09/28/20 (!) 154/101  06/22/20 132/90   Anxiety- She continues taking 7.5 mg buspirone 4x daily PO and reports no improvements while taking it at this time. She reports that her nephew was murdered recently and it has been causing her stress and may be increasing her blood pressure as well. She is also requesting a refill for 15 mg buspirone as well.  Immunizations- She has 2 Covid-19 vaccines at this time and is not interested in getting the booster vaccines.  Thyroid- Her other provider informed her that her thyroid was is enlarged but her thyroid levels are normal as well.   Health Maintenance Due  Topic Date Due   Hepatitis C Screening  Never done   COVID-19 Vaccine (3 - Booster for Pfizer series) 08/04/2020   INFLUENZA VACCINE  09/28/2020    Past Medical History:  Diagnosis Date   Asthma    Hypertension    Pneumonia     Past Surgical History:  Procedure Laterality Date   TUBAL LIGATION      Family History  Problem Relation Age of Onset    Sickle cell anemia Father    Diabetes Maternal Grandmother    Hypertension Maternal Grandmother    Diabetes Maternal Grandfather    Hypertension Maternal Grandfather    Diabetes Paternal Grandmother    Diabetes Paternal Grandfather     Social History   Socioeconomic History   Marital status: Divorced    Spouse name: Not on file   Number of children: Not on file   Years of education: Not on file   Highest education level: Not on file  Occupational History   Not on file  Tobacco Use   Smoking status: Never   Smokeless tobacco: Never  Vaping Use   Vaping Use: Never used  Substance and Sexual Activity   Alcohol use: Yes    Comment: occasionally throughout year   Drug use: No   Sexual activity: Not Currently    Birth control/protection: Surgical  Other Topics Concern   Not on file  Social History Narrative   Works as a Quarry manager at an agency   4 children (1 daughter and 3 sons) Her 3 sons live at home   Has one granddaughter   60 Divorced   Enjoys spending time with her children, shopping, netflix, going out, vacation    Social Determinants of Health   Financial Resource Strain: Not on file  Food Insecurity: Not on file  Transportation Needs: Not on file  Physical Activity: Not on file  Stress: Not on file  Social Connections: Not on file  Intimate Partner Violence: Not on file    Outpatient Medications Prior to Visit  Medication Sig Dispense Refill   amLODipine (NORVASC) 10 MG tablet TAKE ONE TABLET BY MOUTH ONE TIME DAILY 30 tablet 1   Biotin 1000 MCG tablet Take 1,000 mcg by mouth daily.     metoprolol succinate (TOPROL-XL) 50 MG 24 hr tablet TAKE ONE TABLET BY MOUTH ONE TIME DAILY *TAKE WITH OR IMMEDIATELY FOLLOWING A MEAL* 90 tablet 1   Multiple Vitamin (MULTIVITAMIN) tablet Take 1 tablet by mouth daily.     OVER THE COUNTER MEDICATION BLACKSEED OIL.  Take 2 teaspoons a day with honey.     busPIRone (BUSPAR) 15 MG tablet Take 0.5 tablets (7.5 mg total) by mouth  2 (two) times daily. 90 tablet 1   No facility-administered medications prior to visit.    Allergies  Allergen Reactions   Lisinopril     cough    Review of Systems  Psychiatric/Behavioral:  The patient is nervous/anxious.       Objective:    Physical Exam Constitutional:      General: She is not in acute distress.    Appearance: Normal appearance. She is not ill-appearing.  HENT:     Head: Normocephalic and atraumatic.     Right Ear: External ear normal.     Left Ear: External ear normal.  Eyes:     Extraocular Movements: Extraocular movements intact.     Pupils: Pupils are equal, round, and reactive to light.  Neck:     Thyroid: Thyromegaly (mild bilateral thyroid enlargement noted. Difficult to assess due to overlying adipose) present.  Cardiovascular:     Rate and Rhythm: Normal rate and regular rhythm.     Heart sounds: Normal heart sounds. No murmur heard.   No gallop.  Pulmonary:     Effort: Pulmonary effort is normal. No respiratory distress.     Breath sounds: Normal breath sounds. No wheezing or rales.  Skin:    General: Skin is warm and dry.  Neurological:     Mental Status: She is alert and oriented to person, place, and time.  Psychiatric:        Mood and Affect: Affect is tearful.        Behavior: Behavior normal.        Judgment: Judgment normal.    BP 138/83 (BP Location: Right Arm, Patient Position: Sitting, Cuff Size: Small)   Pulse 85   Temp 98.6 F (37 C) (Oral)   Resp 16   Ht '5\' 3"'$  (1.6 m)   Wt 169 lb (76.7 kg)   LMP 09/24/2020 (Exact Date)   SpO2 100%   BMI 29.94 kg/m  Wt Readings from Last 3 Encounters:  10/02/20 169 lb (76.7 kg)  09/28/20 171 lb (77.6 kg)  06/22/20 172 lb (78 kg)       Assessment & Plan:   Problem List Items Addressed This Visit       Unprioritized   Thyromegaly - Primary    New. TFT's review and all WNL.  Will obtain thyroid US for further evaluation.        Relevant Orders   US THYROID   History  of COVID-19    Encouraged pt to get the new booster vaccine this fall.        Essential hypertension    BP Readings from Last 3 Encounters:  10/02/20 138/83  09/28/20 (!) 154/101  06/22/20  132/90  Currently meds include amlodipine '10mg'$  and toprol xl '50mg'$ .       Anxiety    Currently maintained on buspar.  Recently worsened by family tragedy.  I have advised her to reach out to me if her symptoms worsen or if they do not improve.        Relevant Medications   busPIRone (BUSPAR) 15 MG tablet     Meds ordered this encounter  Medications   busPIRone (BUSPAR) 15 MG tablet    Sig: Take 1 tablet (15 mg total) by mouth 2 (two) times daily.    Dispense:  180 tablet    Refill:  1    Requested drug refills are authorized, however, the patient needs further evaluation and/or laboratory testing before further refills are given. Ask her to make an appointment for this.    Order Specific Question:   Supervising Provider    Answer:   Mosie Lukes [[id number]]    I, [name] NP, personally preformed the services described in this documentation.  All medical record entries made by the scribe were at my direction and in my presence.  I have reviewed the chart and discharge instructions (if applicable) and agree that the record reflects my personal performance and is accurate and complete. 10/02/2020   I,Shehryar Baig,acting as a Education administrator for Nance Pear, NP.,have documented all relevant documentation on the behalf of Nance Pear, NP,as directed by  Nance Pear, NP while in the presence of Nance Pear, NP.   Nance Pear, NP

## 2020-10-02 NOTE — Assessment & Plan Note (Addendum)
Currently maintained on buspar.  Recently worsened by family tragedy.  I have advised her to reach out to me if her symptoms worsen or if they do not improve.

## 2020-10-02 NOTE — Patient Instructions (Addendum)
Take buspar '15mg'$  twice daily for anxiety- please let me know if your symptoms do not improve in the next 1 month. Take the amlodipine and metoprolol 12 hours apart. Continue to monitor your blood pressure and send me any high readings you get.

## 2020-10-02 NOTE — Assessment & Plan Note (Signed)
New. TFT's review and all WNL.  Will obtain thyroid US for further evaluation.

## 2020-10-07 ENCOUNTER — Ambulatory Visit (HOSPITAL_BASED_OUTPATIENT_CLINIC_OR_DEPARTMENT_OTHER): Admission: RE | Admit: 2020-10-07 | Payer: 59 | Source: Ambulatory Visit

## 2020-10-09 ENCOUNTER — Encounter: Payer: Self-pay | Admitting: Family

## 2020-10-09 ENCOUNTER — Ambulatory Visit (HOSPITAL_BASED_OUTPATIENT_CLINIC_OR_DEPARTMENT_OTHER)
Admission: RE | Admit: 2020-10-09 | Discharge: 2020-10-09 | Disposition: A | Discharge status: Home / Self Care | Payer: 59 | Source: Ambulatory Visit | Attending: Family | Admitting: Family

## 2020-10-09 ENCOUNTER — Other Ambulatory Visit: Payer: Self-pay

## 2020-10-09 DIAGNOSIS — E010 Iodine-deficiency related diffuse (endemic) goiter: Secondary | ICD-10-CM | POA: Diagnosis present

## 2020-10-15 ENCOUNTER — Ambulatory Visit (INDEPENDENT_AMBULATORY_CARE_PROVIDER_SITE_OTHER): Payer: 59 | Admitting: Psychology

## 2020-10-15 DIAGNOSIS — F89 Unspecified disorder of psychological development: Secondary | ICD-10-CM | POA: Diagnosis not present

## 2020-10-28 ENCOUNTER — Ambulatory Visit: Payer: 59 | Admitting: Psychology

## 2020-11-07 ENCOUNTER — Other Ambulatory Visit: Payer: Self-pay | Admitting: Family

## 2020-11-13 ENCOUNTER — Ambulatory Visit (INDEPENDENT_AMBULATORY_CARE_PROVIDER_SITE_OTHER): Payer: 59 | Admitting: Psychology

## 2020-11-13 DIAGNOSIS — F902 Attention-deficit hyperactivity disorder, combined type: Secondary | ICD-10-CM | POA: Diagnosis not present

## 2020-11-13 DIAGNOSIS — F411 Generalized anxiety disorder: Secondary | ICD-10-CM | POA: Diagnosis not present

## 2020-11-24 ENCOUNTER — Encounter: Payer: Self-pay | Admitting: Family

## 2020-11-24 ENCOUNTER — Other Ambulatory Visit: Payer: Self-pay

## 2020-11-24 ENCOUNTER — Ambulatory Visit (INDEPENDENT_AMBULATORY_CARE_PROVIDER_SITE_OTHER): Payer: 59 | Admitting: Family

## 2020-11-24 DIAGNOSIS — I10 Essential (primary) hypertension: Secondary | ICD-10-CM | POA: Diagnosis not present

## 2020-11-24 DIAGNOSIS — S39012A Strain of muscle, fascia and tendon of lower back, initial encounter: Secondary | ICD-10-CM | POA: Diagnosis not present

## 2020-11-24 DIAGNOSIS — Z23 Encounter for immunization: Secondary | ICD-10-CM

## 2020-11-24 DIAGNOSIS — F419 Anxiety disorder, unspecified: Secondary | ICD-10-CM

## 2020-11-24 DIAGNOSIS — S29019A Strain of muscle and tendon of unspecified wall of thorax, initial encounter: Secondary | ICD-10-CM | POA: Insufficient documentation

## 2020-11-24 MED ORDER — METHOCARBAMOL 500 MG PO TABS: 500.0000 mg | ORAL_TABLET | Freq: Three times a day (TID) | ORAL | 0 refills | Status: DC | PRN

## 2020-11-24 MED ORDER — MELOXICAM 7.5 MG PO TABS: 7.5000 mg | ORAL_TABLET | Freq: Every day | ORAL | 0 refills | Status: DC
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
## 2020-11-24 NOTE — Assessment & Plan Note (Signed)
New. Will rx with meloxicam 7.5mg  once daily, robaxin 500mg  PO TID prn muscle spasm. Pt is advised to call if symptoms worsen or if symptoms fail to improve.

## 2020-11-24 NOTE — Assessment & Plan Note (Signed)
BP Readings from Last 3 Encounters:  11/24/20 (!) 148/92  10/02/20 138/83  09/28/20 (!) 154/101   BP is mildly elevated today. I have advised the patient to check bp several times at home and send me her readings via mychart.

## 2020-11-24 NOTE — Patient Instructions (Signed)
Please begin meloxicam once daily 7.5mg  once daily. You may use robaxin as needed for muscle tightness.
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
## 2020-11-30 ENCOUNTER — Encounter: Payer: Self-pay | Admitting: Family

## 2020-12-15 ENCOUNTER — Encounter: Payer: Self-pay | Admitting: Family

## 2020-12-16 ENCOUNTER — Telehealth: Payer: Self-pay | Admitting: Family

## 2020-12-16 NOTE — Telephone Encounter (Signed)
Pt is requesting immunization record for her flu shot. Pt would like it sent to [email or web address]. She stated she is unable to pull it through [place]. Please advise.

## 2020-12-16 NOTE — Telephone Encounter (Signed)
Immunization record emailed to patient as secured at her request

## 2021-04-07 ENCOUNTER — Ambulatory Visit: Payer: Managed Care, Other (non HMO) | Admitting: Family

## 2021-04-07 NOTE — Progress Notes (Incomplete)
Subjective:   By signing my name below, I, Nicole Larson, attest that this documentation has been prepared under the direction and in the presence of [name], NP 04/07/2021   Patient ID: Nicole Larson, female    DOB: Jul 06, 1975, 46 y.o.   MRN: [id number]  No chief complaint on file.   HPI Patient is in today for an office visit.  Past Medical History:  Diagnosis Date   Asthma    Hypertension    Pneumonia     Past Surgical History:  Procedure Laterality Date   TUBAL LIGATION      Family History  Problem Relation Age of Onset   Sickle cell anemia Father    Diabetes Maternal Grandmother    Hypertension Maternal Grandmother    Diabetes Maternal Grandfather    Hypertension Maternal Grandfather    Diabetes Paternal Grandmother    Diabetes Paternal Grandfather     Social History   Socioeconomic History   Marital status: Divorced    Spouse name: Not on file   Number of children: Not on file   Years of education: Not on file   Highest education level: Not on file  Occupational History   Not on file  Tobacco Use   Smoking status: Never   Smokeless tobacco: Never  Vaping Use   Vaping Use: Never used  Substance and Sexual Activity   Alcohol use: Yes    Comment: occasionally throughout year   Drug use: No   Sexual activity: Not Currently    Birth control/protection: Surgical  Other Topics Concern   Not on file  Social History Narrative   Works as a Quarry manager at an agency   4 children (1 daughter and 3 sons) Her 3 sons live at home   Has one granddaughter   19 Divorced   Enjoys spending time with her children, shopping, netflix, going out, vacation    Social Determinants of Radio broadcast assistant Strain: Not on file  Food Insecurity: Not on file  Transportation Needs: Not on file  Physical Activity: Not on file  Stress: Not on file  Social Connections: Not on file  Intimate Partner Violence: Not on file    Outpatient Medications Prior  to Visit  Medication Sig Dispense Refill   amLODipine (NORVASC) 10 MG tablet TAKE ONE TABLET BY MOUTH ONE TIME DAILY 90 tablet 1   Biotin 1000 MCG tablet Take 1,000 mcg by mouth daily.     busPIRone (BUSPAR) 15 MG tablet TAKE ONE TABLET BY MOUTH TWICE A DAY 180 tablet 1   meloxicam (MOBIC) 7.5 MG tablet Take 1 tablet (7.5 mg total) by mouth daily. 14 tablet 0   methocarbamol (ROBAXIN) 500 MG tablet Take 1 tablet (500 mg total) by mouth every 8 (eight) hours as needed for muscle spasms. 20 tablet 0   metoprolol succinate (TOPROL-XL) 50 MG 24 hr tablet TAKE ONE TABLET BY MOUTH ONE TIME DAILY *TAKE WITH OR IMMEDIATELY FOLLOWING A MEAL* 90 tablet 1   Multiple Vitamin (MULTIVITAMIN) tablet Take 1 tablet by mouth daily.     OVER THE COUNTER MEDICATION BLACKSEED OIL.  Take 2 teaspoons a day with honey.     No facility-administered medications prior to visit.    Allergies  Allergen Reactions   Lisinopril     cough    Review of Systems  Constitutional:  Negative for fever.  HENT:  Negative for ear pain and hearing loss.        (-)nystagmus (-)adenopathy  Eyes:  Negative for blurred vision.  Respiratory:  Negative for cough, shortness of breath and wheezing.   Cardiovascular:  Negative for chest pain and leg swelling.  Gastrointestinal:  Negative for blood in stool, diarrhea, nausea and vomiting.  Genitourinary:  Negative for dysuria and frequency.  Musculoskeletal:  Negative for joint pain and myalgias.  Skin:  Negative for rash.  Neurological:  Negative for headaches.  Psychiatric/Behavioral:  Negative for depression. The patient is not nervous/anxious.       Objective:    Physical Exam Constitutional:      General: She is not in acute distress.    Appearance: Normal appearance. She is not ill-appearing.  HENT:     Head: Normocephalic and atraumatic.     Right Ear: External ear normal.     Left Ear: External ear normal.  Eyes:     Extraocular Movements: Extraocular movements  intact.     Pupils: Pupils are equal, round, and reactive to light.  Cardiovascular:     Rate and Rhythm: Normal rate and regular rhythm.     Pulses: Normal pulses.     Heart sounds: Normal heart sounds. No murmur heard. Pulmonary:     Effort: Pulmonary effort is normal. No respiratory distress.     Breath sounds: Normal breath sounds. No wheezing or rhonchi.  Abdominal:     General: Bowel sounds are normal. There is no distension.     Palpations: Abdomen is soft.     Tenderness: There is no abdominal tenderness. There is no guarding or rebound.  Musculoskeletal:     Cervical back: Neck supple.  Lymphadenopathy:     Cervical: No cervical adenopathy.  Skin:    General: Skin is warm and dry.  Neurological:     Mental Status: She is alert and oriented to person, place, and time.  Psychiatric:        Behavior: Behavior normal.        Judgment: Judgment normal.    There were no vitals taken for this visit. Wt Readings from Last 3 Encounters:  11/24/20 168 lb (76.2 kg)  10/02/20 169 lb (76.7 kg)  09/28/20 171 lb (77.6 kg)    Diabetic Foot Exam - Simple   No data filed    Lab Results  Component Value Date   WBC 6.9 05/18/2020   HGB 12.8 05/18/2020   HCT 37.0 05/18/2020   PLT 298.0 05/18/2020   GLUCOSE 100 (H) 05/18/2020   CHOL 173 05/18/2020   TRIG 55.0 05/18/2020   HDL 54.50 05/18/2020   LDLCALC 107 (H) 05/18/2020   ALT 20 05/18/2020   AST 16 05/18/2020   NA 140 05/18/2020   K 3.9 05/18/2020   CL 102 05/18/2020   CREATININE 1.04 05/18/2020   BUN 14 05/18/2020   CO2 31 05/18/2020   TSH 0.982 09/28/2020   HGBA1C 5.1 04/10/2019    Lab Results  Component Value Date   TSH 0.982 09/28/2020   Lab Results  Component Value Date   WBC 6.9 05/18/2020   HGB 12.8 05/18/2020   HCT 37.0 05/18/2020   MCV 80.2 05/18/2020   PLT 298.0 05/18/2020   Lab Results  Component Value Date   NA 140 05/18/2020   K 3.9 05/18/2020   CO2 31 05/18/2020   GLUCOSE 100 (H)  05/18/2020   BUN 14 05/18/2020   CREATININE 1.04 05/18/2020   BILITOT 0.4 05/18/2020   ALKPHOS 48 05/18/2020   AST 16 05/18/2020   ALT 20 05/18/2020  PROT 7.2 05/18/2020   ALBUMIN 4.4 05/18/2020   CALCIUM 9.5 05/18/2020   ANIONGAP 10 11/19/2019   GFR 66.39 05/18/2020   Lab Results  Component Value Date   CHOL 173 05/18/2020   Lab Results  Component Value Date   HDL 54.50 05/18/2020   Lab Results  Component Value Date   LDLCALC 107 (H) 05/18/2020   Lab Results  Component Value Date   TRIG 55.0 05/18/2020   Lab Results  Component Value Date   CHOLHDL 3 05/18/2020   Lab Results  Component Value Date   HGBA1C 5.1 04/10/2019       Assessment & Plan:   Problem List Items Addressed This Visit   None   No orders of the defined types were placed in this encounter.   I,Nicole Larson,acting as a Education administrator for [employer], NP.,have documented all relevant documentation on the behalf of Nance Pear, NP,as directed by  Nance Pear, NP while in the presence of Nance Pear, NP.   I, [name], NP, personally preformed the services described in this documentation.  All medical record entries made by the scribe were at my direction and in my presence.  I have reviewed the chart and discharge instructions (if applicable) and agree that the record reflects my personal performance and is accurate and complete. 04/07/2021

## 2021-04-12 ENCOUNTER — Ambulatory Visit: Payer: Managed Care, Other (non HMO) | Admitting: Family

## 2021-04-12 NOTE — Progress Notes (Incomplete)
Subjective:   By signing my name below, I, [name], attest that this documentation has been prepared under the direction and in the presence of [name], NP 04/12/2021      Patient ID: Nicole Larson, female    DOB: Aug 19, 1975, 46 y.o.   MRN: [id number]  No chief complaint on file.   HPI Patient is in today for an office visit  Past Medical History:  Diagnosis Date   Asthma    Hypertension    Pneumonia     Past Surgical History:  Procedure Laterality Date   TUBAL LIGATION      Family History  Problem Relation Age of Onset   Sickle cell anemia Father    Diabetes Maternal Grandmother    Hypertension Maternal Grandmother    Diabetes Maternal Grandfather    Hypertension Maternal Grandfather    Diabetes Paternal Grandmother    Diabetes Paternal Grandfather     Social History   Socioeconomic History   Marital status: Divorced    Spouse name: Not on file   Number of children: Not on file   Years of education: Not on file   Highest education level: Not on file  Occupational History   Not on file  Tobacco Use   Smoking status: Never   Smokeless tobacco: Never  Vaping Use   Vaping Use: Never used  Substance and Sexual Activity   Alcohol use: Yes    Comment: occasionally throughout year   Drug use: No   Sexual activity: Not Currently    Birth control/protection: Surgical  Other Topics Concern   Not on file  Social History Narrative   Works as a Quarry manager at an agency   4 children (1 daughter and 3 sons) Her 3 sons live at home   Has one granddaughter   41 Divorced   Enjoys spending time with her children, shopping, netflix, going out, vacation    Social Determinants of Radio broadcast assistant Strain: Not on file  Food Insecurity: Not on file  Transportation Needs: Not on file  Physical Activity: Not on file  Stress: Not on file  Social Connections: Not on file  Intimate Partner Violence: Not on file    Outpatient Medications Prior  to Visit  Medication Sig Dispense Refill   amLODipine (NORVASC) 10 MG tablet TAKE ONE TABLET BY MOUTH ONE TIME DAILY 90 tablet 1   Biotin 1000 MCG tablet Take 1,000 mcg by mouth daily.     busPIRone (BUSPAR) 15 MG tablet TAKE ONE TABLET BY MOUTH TWICE A DAY 180 tablet 1   meloxicam (MOBIC) 7.5 MG tablet Take 1 tablet (7.5 mg total) by mouth daily. 14 tablet 0   methocarbamol (ROBAXIN) 500 MG tablet Take 1 tablet (500 mg total) by mouth every 8 (eight) hours as needed for muscle spasms. 20 tablet 0   metoprolol succinate (TOPROL-XL) 50 MG 24 hr tablet TAKE ONE TABLET BY MOUTH ONE TIME DAILY *TAKE WITH OR IMMEDIATELY FOLLOWING A MEAL* 90 tablet 1   Multiple Vitamin (MULTIVITAMIN) tablet Take 1 tablet by mouth daily.     OVER THE COUNTER MEDICATION BLACKSEED OIL.  Take 2 teaspoons a day with honey.     No facility-administered medications prior to visit.    Allergies  Allergen Reactions   Lisinopril     cough    Review of Systems  Constitutional:  Negative for fever.  HENT:  Negative for ear pain and hearing loss.        (-)  nystagmus (-)adenopathy  Eyes:  Negative for blurred vision.  Respiratory:  Negative for cough, shortness of breath and wheezing.   Cardiovascular:  Negative for chest pain and leg swelling.  Gastrointestinal:  Negative for blood in stool, diarrhea, nausea and vomiting.  Genitourinary:  Negative for dysuria and frequency.  Musculoskeletal:  Negative for joint pain and myalgias.  Skin:  Negative for rash.  Neurological:  Negative for headaches.  Psychiatric/Behavioral:  Negative for depression. The patient is not nervous/anxious.       Objective:    Physical Exam Constitutional:      General: She is not in acute distress.    Appearance: Normal appearance. She is not ill-appearing.  HENT:     Head: Normocephalic and atraumatic.     Right Ear: External ear normal.     Left Ear: External ear normal.  Eyes:     Extraocular Movements: Extraocular movements  intact.     Pupils: Pupils are equal, round, and reactive to light.  Cardiovascular:     Rate and Rhythm: Normal rate and regular rhythm.     Pulses: Normal pulses.     Heart sounds: Normal heart sounds. No murmur heard. Pulmonary:     Effort: Pulmonary effort is normal. No respiratory distress.     Breath sounds: Normal breath sounds. No wheezing or rhonchi.  Abdominal:     General: Bowel sounds are normal. There is no distension.     Palpations: Abdomen is soft.     Tenderness: There is no abdominal tenderness. There is no guarding or rebound.  Musculoskeletal:     Cervical back: Neck supple.  Lymphadenopathy:     Cervical: No cervical adenopathy.  Skin:    General: Skin is warm and dry.  Neurological:     Mental Status: She is alert and oriented to person, place, and time.  Psychiatric:        Behavior: Behavior normal.        Judgment: Judgment normal.    There were no vitals taken for this visit. Wt Readings from Last 3 Encounters:  11/24/20 168 lb (76.2 kg)  10/02/20 169 lb (76.7 kg)  09/28/20 171 lb (77.6 kg)    Diabetic Foot Exam - Simple   No data filed    Lab Results  Component Value Date   WBC 6.9 05/18/2020   HGB 12.8 05/18/2020   HCT 37.0 05/18/2020   PLT 298.0 05/18/2020   GLUCOSE 100 (H) 05/18/2020   CHOL 173 05/18/2020   TRIG 55.0 05/18/2020   HDL 54.50 05/18/2020   LDLCALC 107 (H) 05/18/2020   ALT 20 05/18/2020   AST 16 05/18/2020   NA 140 05/18/2020   K 3.9 05/18/2020   CL 102 05/18/2020   CREATININE 1.04 05/18/2020   BUN 14 05/18/2020   CO2 31 05/18/2020   TSH 0.982 09/28/2020   HGBA1C 5.1 04/10/2019    Lab Results  Component Value Date   TSH 0.982 09/28/2020   Lab Results  Component Value Date   WBC 6.9 05/18/2020   HGB 12.8 05/18/2020   HCT 37.0 05/18/2020   MCV 80.2 05/18/2020   PLT 298.0 05/18/2020   Lab Results  Component Value Date   NA 140 05/18/2020   K 3.9 05/18/2020   CO2 31 05/18/2020   GLUCOSE 100 (H)  05/18/2020   BUN 14 05/18/2020   CREATININE 1.04 05/18/2020   BILITOT 0.4 05/18/2020   ALKPHOS 48 05/18/2020   AST 16 05/18/2020   ALT 20  05/18/2020   PROT 7.2 05/18/2020   ALBUMIN 4.4 05/18/2020   CALCIUM 9.5 05/18/2020   ANIONGAP 10 11/19/2019   GFR 66.39 05/18/2020   Lab Results  Component Value Date   CHOL 173 05/18/2020   Lab Results  Component Value Date   HDL 54.50 05/18/2020   Lab Results  Component Value Date   LDLCALC 107 (H) 05/18/2020   Lab Results  Component Value Date   TRIG 55.0 05/18/2020   Lab Results  Component Value Date   CHOLHDL 3 05/18/2020   Lab Results  Component Value Date   HGBA1C 5.1 04/10/2019       Assessment & Plan:   Problem List Items Addressed This Visit   None   No orders of the defined types were placed in this encounter.   I,[name],acting as a Education administrator for [employer], NP.,have documented all relevant documentation on the behalf of Nance Pear, NP,as directed by  Nance Pear, NP while in the presence of Nance Pear, NP.   I, [name], NP , personally preformed the services described in this documentation.  All medical record entries made by the scribe were at my direction and in my presence.  I have reviewed the chart and discharge instructions (if applicable) and agree that the record reflects my personal performance and is accurate and complete. 04/12/2021

## 2021-06-28 ENCOUNTER — Telehealth: Payer: Self-pay | Admitting: Family

## 2021-06-28 ENCOUNTER — Ambulatory Visit (INDEPENDENT_AMBULATORY_CARE_PROVIDER_SITE_OTHER): Payer: Managed Care, Other (non HMO) | Admitting: Family

## 2021-06-28 ENCOUNTER — Other Ambulatory Visit: Payer: Self-pay

## 2021-06-28 ENCOUNTER — Encounter: Payer: Self-pay | Admitting: Family

## 2021-06-28 VITALS — BP 144/90 | HR 98 | Temp 98.6°F | Resp 16 | Wt 168.0 lb

## 2021-06-28 DIAGNOSIS — I10 Essential (primary) hypertension: Secondary | ICD-10-CM

## 2021-06-28 DIAGNOSIS — F909 Attention-deficit hyperactivity disorder, unspecified type: Secondary | ICD-10-CM | POA: Insufficient documentation

## 2021-06-28 DIAGNOSIS — F419 Anxiety disorder, unspecified: Secondary | ICD-10-CM | POA: Diagnosis not present

## 2021-06-28 MED ORDER — FLUOXETINE HCL 10 MG PO TABS: 10.0000 mg | ORAL_TABLET | Freq: Every day | ORAL | 1 refills | Status: DC

## 2021-06-28 MED ORDER — FLUOXETINE HCL 10 MG PO CAPS: 10.0000 mg | ORAL_CAPSULE | Freq: Every day | ORAL | 3 refills | Status: DC

## 2021-06-28 MED ORDER — HYDROXYZINE PAMOATE 25 MG PO CAPS: 25.0000 mg | ORAL_CAPSULE | Freq: Three times a day (TID) | ORAL | 0 refills | Status: DC | PRN

## 2021-06-28 NOTE — Telephone Encounter (Signed)
Patient called for status update on request  ? ?Patient would like for someone to call back  ? ?Please advise  ?

## 2021-06-28 NOTE — Telephone Encounter (Signed)
Please call and request copy of ADHD evaluation on Ms. Nicole Larson from [place] [facility]'s office.  ?

## 2021-06-28 NOTE — Patient Instructions (Signed)
Please begin adderall xr once daily for ADHD. ?Please begin Prozac '10mg'$  once daily for anxiety.  ?Please add hydroxyzine as needed for anxiety.  ?

## 2021-06-28 NOTE — Assessment & Plan Note (Signed)
BP Readings from Last 3 Encounters:  ?06/28/21 (!) 144/90  ?11/24/20 (!) 148/92  ?10/02/20 138/83  ? ?BP mildly elevated today. Plan to repeat next visit. If BP remains above goal will adjust bp medication.  ?

## 2021-06-28 NOTE — Telephone Encounter (Signed)
[facility] would like to speak with someone regarding fluoxetine that was prescribed by [place]  ? ?[place] - [facility] [phone] ?

## 2021-06-28 NOTE — Assessment & Plan Note (Addendum)
Uncontrolled on Buspar.  Will give trial of low dose prozac.  She had some nausea with cymbalta and lexapro. Will also add hydroxyzine '25mg'$  prn.  I gave her information for scheduling an appointment with a counselor.  ?

## 2021-06-28 NOTE — Assessment & Plan Note (Signed)
Reports that she completed an ADHD evaluation with Dr. Janelle Floor and was told that she does have ADHD. She is interested in treatment to help her focus.  Will request copy of her evaluation.  In the meantime, will begin adderall xr '10mg'$  once daily.  A controlled substance contract is signed and a UDS will be obtained.  ?

## 2021-06-28 NOTE — Progress Notes (Signed)
? ?Subjective:  ? ?By signing my name below, I, Nicole Larson, attest that this documentation has been prepared under the direction and in the presence of [name], NP 06/28/2021  ?  ? ? Patient ID: Nicole Larson, female    DOB: 12-Oct-1975, 46 y.o.   MRN: [id number] ? ?Chief Complaint  ?Patient presents with  ? Anxiety  ?  "Here to discuss buspar prescription"  ? [place]aving  ?  Patient reports having recent death in family  ? ? ?HPI ?Patient is in today for an office visit. ? ?Grief- She recently lost her cousin and is not doing very well emotionally at this time. She has good family support at this time.  ? ?Anxiety- She reports that 15 mg Buspar is not helping to manage her anxiety. She is about to start travel nursing and is asking for medication that will help her to calm down and go to work.  She has tried lexapro and Cymbalta but they had side effects. ? ?ADHD- She is requesting medication to manage her ADHD. She has trouble staying on tasks and describes "feeling like a shell". She went to see a psychologist for formal evaluation and reports that she was diagnosed with ADHD.  ?Past Medical History:  ?Diagnosis Date  ? Asthma   ? Hypertension   ? Pneumonia   ? Upper airway cough syndrome 12/27/2019  ? Onset in Aug 2021 related to covid - d/c acei 12/26/2019 with max rx for gerd   ? ? ?Past Surgical History:  ?Procedure Laterality Date  ? TUBAL LIGATION    ? ? ?Family History  ?Problem Relation Age of Onset  ? Sickle cell anemia Father   ? Diabetes Maternal Grandmother   ? Hypertension Maternal Grandmother   ? Diabetes Maternal Grandfather   ? Hypertension Maternal Grandfather   ? Diabetes Paternal Grandmother   ? Diabetes Paternal Grandfather   ? ? ?Social History  ? ?Socioeconomic History  ? Marital status: Divorced  ?  Spouse name: Not on file  ? Number of children: Not on file  ? Years of education: Not on file  ? Highest education level: Not on file  ?Occupational History  ? Not on file  ?Tobacco  Use  ? Smoking status: Never  ? Smokeless tobacco: Never  ?Vaping Use  ? Vaping Use: Never used  ?Substance and Sexual Activity  ? Alcohol use: Yes  ?  Comment: occasionally throughout year  ? Drug use: No  ? Sexual activity: Not Currently  ?  Birth control/protection: Surgical  ?Other Topics Concern  ? Not on file  ?Social History Narrative  ? Works as a Quarry manager at an agency  ? 4 children (1 daughter and 3 sons) Her 3 sons live at home  ? Has one granddaughter  ? Happily Divorced  ? Enjoys spending time with her children, shopping, netflix, going out, vacation   ? ?Social Determinants of Health  ? ?Financial Resource Strain: Not on file  ?Food Insecurity: Not on file  ?Transportation Needs: Not on file  ?Physical Activity: Not on file  ?Stress: Not on file  ?Social Connections: Not on file  ?Intimate Partner Violence: Not on file  ? ? ?Outpatient Medications Prior to Visit  ?Medication Sig Dispense Refill  ? amLODipine (NORVASC) 10 MG tablet TAKE ONE TABLET BY MOUTH ONE TIME DAILY 90 tablet 1  ? busPIRone (BUSPAR) 15 MG tablet Take 15 mg by mouth 2 (two) times daily.    ? metoprolol succinate (TOPROL-XL)  50 MG 24 hr tablet TAKE ONE TABLET BY MOUTH ONE TIME DAILY *TAKE WITH OR IMMEDIATELY FOLLOWING A MEAL* 90 tablet 1  ? Multiple Vitamin (MULTIVITAMIN) tablet Take 1 tablet by mouth daily.    ? OVER THE COUNTER MEDICATION BLACKSEED OIL.  Take 2 teaspoons a day with honey.    ? Biotin 1000 MCG tablet Take 1,000 mcg by mouth daily.    ? busPIRone (BUSPAR) 15 MG tablet TAKE ONE TABLET BY MOUTH TWICE A DAY 180 tablet 1  ? meloxicam (MOBIC) 7.5 MG tablet Take 1 tablet (7.5 mg total) by mouth daily. 14 tablet 0  ? methocarbamol (ROBAXIN) 500 MG tablet Take 1 tablet (500 mg total) by mouth every 8 (eight) hours as needed for muscle spasms. 20 tablet 0  ? ?No facility-administered medications prior to visit.  ? ? ?Allergies  ?Allergen Reactions  ? Lisinopril   ?  cough  ? ? ?Review of Systems  ?HENT:    ?      (-)nystagmus ?(-)adenopathy  ?Psychiatric/Behavioral:  Positive for depression. The patient is nervous/anxious.   ?     (+) grief  ? ?   ?Objective:  ?  ?Physical Exam ?Constitutional:   ?   General: She is not in acute distress. ?   Appearance: Normal appearance. She is not ill-appearing.  ?HENT:  ?   Head: Normocephalic and atraumatic.  ?   Right Ear: External ear normal.  ?   Left Ear: External ear normal.  ?Eyes:  ?   Extraocular Movements: Extraocular movements intact.  ?   Pupils: Pupils are equal, round, and reactive to light.  ?Skin: ?   General: Skin is warm and dry.  ?Neurological:  ?   Mental Status: She is alert and oriented to person, place, and time.  ?Psychiatric:     ?   Mood and Affect: Mood is depressed. Affect is tearful.     ?   Speech: Speech normal.     ?   Behavior: Behavior normal. Behavior is cooperative.     ?   Thought Content: Thought content normal.     ?   Judgment: Judgment normal.  ? ? ?BP (!) 144/90 (BP Location: Right Arm, Patient Position: Sitting, Cuff Size: Small)   Pulse 98   Temp 98.6 ?F (37 ?C) (Oral)   Resp 16   Wt 168 lb (76.2 kg)   SpO2 100%   BMI 29.76 kg/m?  ?Wt Readings from Last 3 Encounters:  ?06/28/21 168 lb (76.2 kg)  ?11/24/20 168 lb (76.2 kg)  ?10/02/20 169 lb (76.7 kg)  ? ?   ?Assessment & Plan:  ? ?Problem List Items Addressed This Visit   ? ?  ? Unprioritized  ? Essential hypertension  ?  BP Readings from Last 3 Encounters:  ?06/28/21 (!) 144/90  ?11/24/20 (!) 148/92  ?10/02/20 138/83  ?BP mildly elevated today. Plan to repeat next visit. If BP remains above goal will adjust bp medication.  ?  ?  ? Anxiety  ?  Uncontrolled on Buspar.  Will give trial of low dose prozac.  She had some nausea with cymbalta and lexapro. Will also add hydroxyzine '25mg'$  prn.  I gave her information for scheduling an appointment with a counselor.  ? ?  ?  ? Relevant Medications  ? busPIRone (BUSPAR) 15 MG tablet  ? FLUoxetine (PROZAC) 10 MG tablet  ? hydrOXYzine (VISTARIL) 25 MG  capsule  ? ADHD - Primary  ?  Reports that  she completed an ADHD evaluation with Dr. Janelle Floor and was told that she does have ADHD. She is interested in treatment to help her focus.  Will request copy of her evaluation.  In the meantime, will begin adderall xr '10mg'$  once daily.  A controlled substance contract is signed and a UDS will be obtained.  ? ?  ?  ? Relevant Orders  ? DRUG MONITORING, PANEL 8 WITH CONFIRMATION, URINE  ? ? ?Meds ordered this encounter  ?Medications  ? FLUoxetine (PROZAC) 10 MG tablet  ?  Sig: Take 1 tablet (10 mg total) by mouth daily.  ?  Dispense:  30 tablet  ?  Refill:  1  ?  Order Specific Question:   Supervising Provider  ?  Answer:   Penni Homans A [[id number]]  ? hydrOXYzine (VISTARIL) 25 MG capsule  ?  Sig: Take 1 capsule (25 mg total) by mouth every 8 (eight) hours as needed.  ?  Dispense:  30 capsule  ?  Refill:  0  ?  Order Specific Question:   Supervising Provider  ?  Answer:   Penni Homans A [[id number]]  ? ?30 minutes spent on today's visit. Time was spent counseling patient on grief/anxiety and ADHD treatment as well as reviewing patient record. ? ?I,[name],acting as a Education administrator for [employer], NP.,have documented all relevant documentation on the behalf of Nance Pear, NP,as directed by  Nance Pear, NP while in the presence of Nance Pear, NP.  ? ?I, [name], NP , personally preformed the services described in this documentation.  All medical record entries made by the scribe were at my direction and in my presence.  I have reviewed the chart and discharge instructions (if applicable) and agree that the record reflects my personal performance and is accurate and complete. 06/28/2021 ?

## 2021-06-29 MED ORDER — FLUOXETINE HCL 10 MG PO CAPS: 10.0000 mg | ORAL_CAPSULE | Freq: Every day | ORAL | 1 refills | Status: DC

## 2021-06-29 MED ORDER — AMPHETAMINE-DEXTROAMPHET ER 10 MG PO CP24: 10.0000 mg | ORAL_CAPSULE | Freq: Every day | ORAL | 0 refills | Status: DC

## 2021-06-29 NOTE — Addendum Note (Signed)
Addended by: [name] on: 06/29/2021 07:24 AM ? ? Modules accepted: Orders ? ?

## 2021-06-29 NOTE — Telephone Encounter (Signed)
Rx's has been [facility] to [facility].  ?

## 2021-07-02 LAB — DRUG TOX MONITOR 1 W/CONF, ORAL FLD

## 2021-07-05 ENCOUNTER — Telehealth: Payer: Self-pay | Admitting: Family

## 2021-07-05 NOTE — Telephone Encounter (Signed)
Patient states that one of her new anxiety medication is making her sleepy, she would like a call back to discuss the side effects and how long they might last since she is unable to drive once she takes it. Please advise.  ?

## 2021-07-06 NOTE — Telephone Encounter (Signed)
Yes, sleepiness is a common side effect. As long as she is finding it helpful for her panic attacks, she can continue to take as needed. She can also cut it in half if she feels like it is too strong.  ?

## 2021-07-06 NOTE — Telephone Encounter (Signed)
Patient advised of provider's comments. Her concern is that her panic attacks usually happen while she is driving. She will try to take 1/2 next time and see how she feels.  ?

## 2021-07-06 NOTE — Telephone Encounter (Signed)
Patient reports Hydroxyzine is making her sleepy. She wanted to know if this is a known side effects and she should continue to take it as needed for panic attacks.  ?

## 2021-08-23 IMAGING — DX DG CHEST 1V PORT
1 series · 1 of 1 positions shown · non-contrast
Comparison: 11/06/2019 chest radiograph and prior.

CLINICAL DATA: COVID positive

EXAM:
PORTABLE CHEST 1 VIEW

[chest ap]
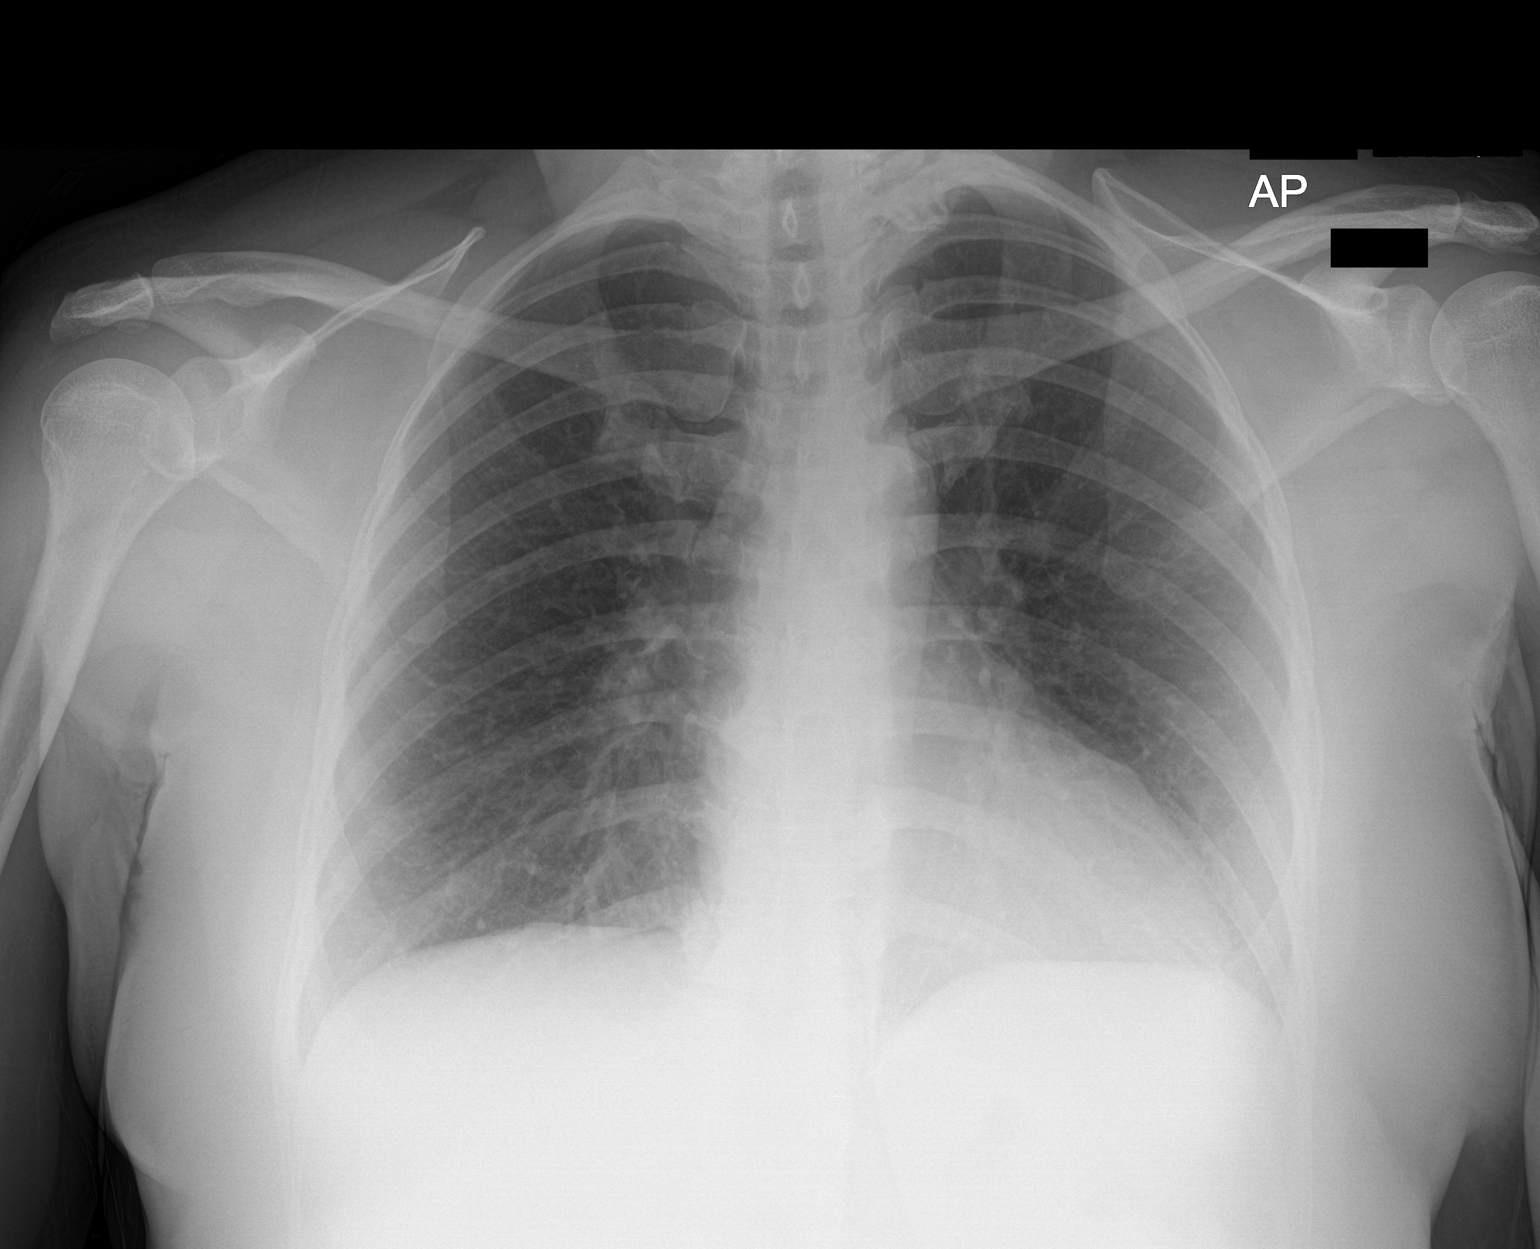

[1 of 1 positions shown; findings below may reference images not displayed]

FINDINGS: The heart size and mediastinal contours are within normal limits.
Both lungs are clear. No pneumothorax or pleural effusion. No acute
osseous abnormality.
IMPRESSION: No focal airspace disease.

## 2021-08-23 IMAGING — CT CT ANGIO CHEST
2 of 8 series · 19 of 36 positions shown · IV contrast (Omnipaque)
Comparison: None.

CLINICAL DATA: COVID pneumonia

EXAM:
CT ANGIOGRAPHY CHEST WITH CONTRAST
TECHNIQUE: Multidetector CT imaging of the chest was performed using the
standard protocol during bolus administration of intravenous
contrast. Multiplanar CT image reconstructions and MIPs were
obtained to evaluate the vascular anatomy.
CONTRAST:  75mL OMNIPAQUE IOHEXOL 350 MG/ML SOLN

[Series 6: pe coronal mpr · coronal · 0.57mm/px · 1 of 151 slices shown]
[im 76/151  mediastinal]
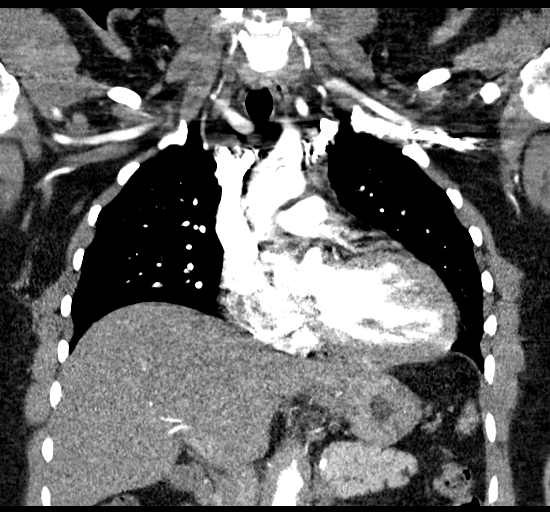

[Series 10: pe thins · axial · 0.72mm/px · z∈[-271,-17]mm · 18 of 284 slices shown]
[im 15/284  lung]
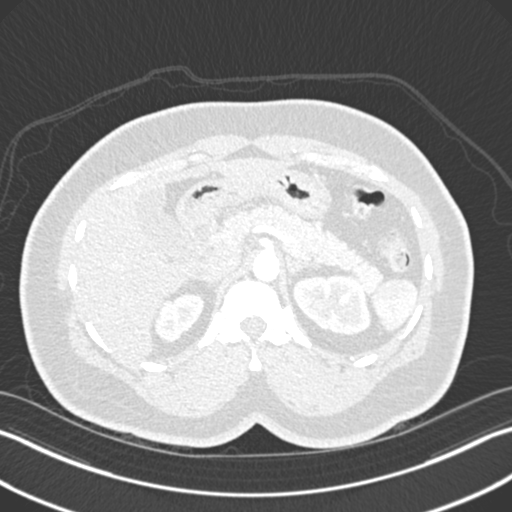
[im 30/284  mediastinal]
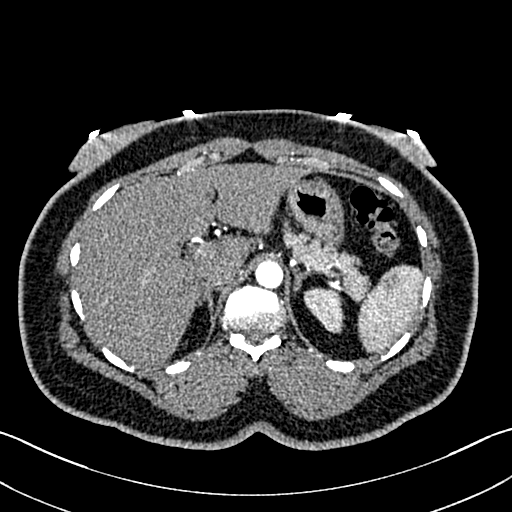
[im 45/284  lung]
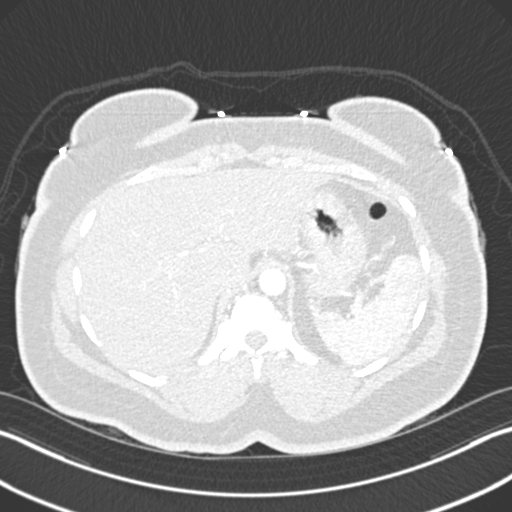
[im 60/284  mediastinal]
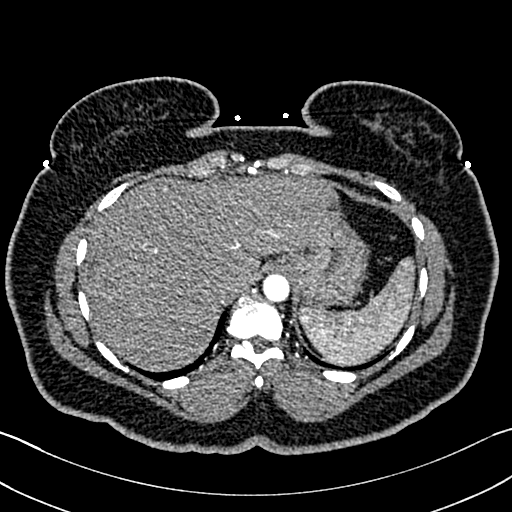
[im 75/284  lung]
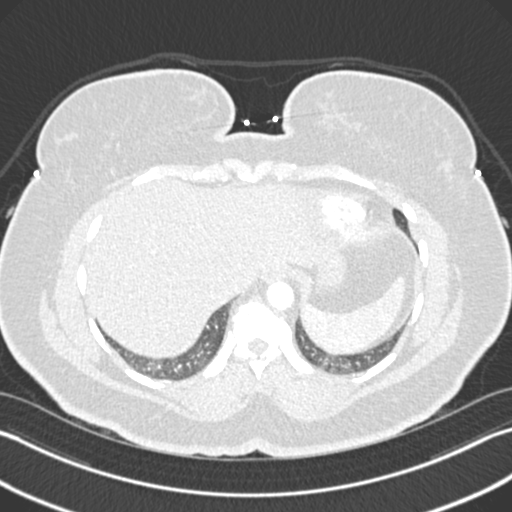
[im 90/284  mediastinal]
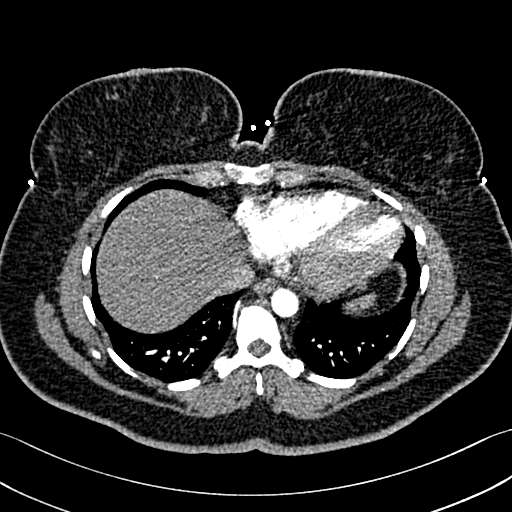
[im 105/284  lung]
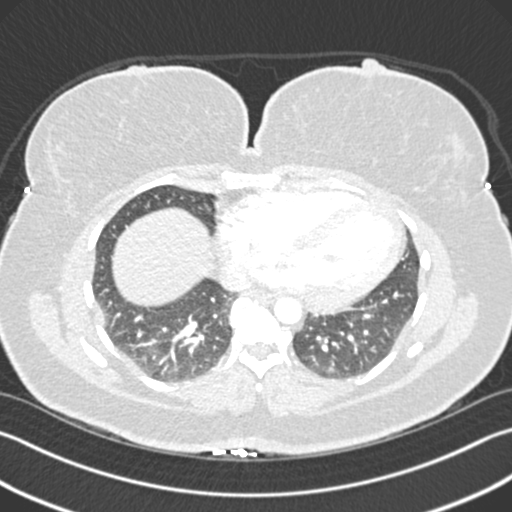
[im 120/284  mediastinal]
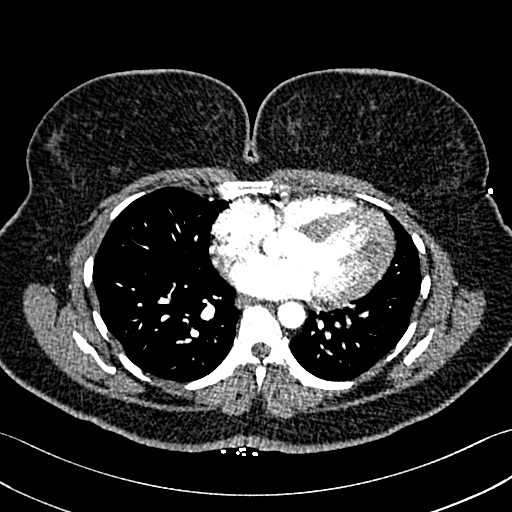
[im 135/284  lung]
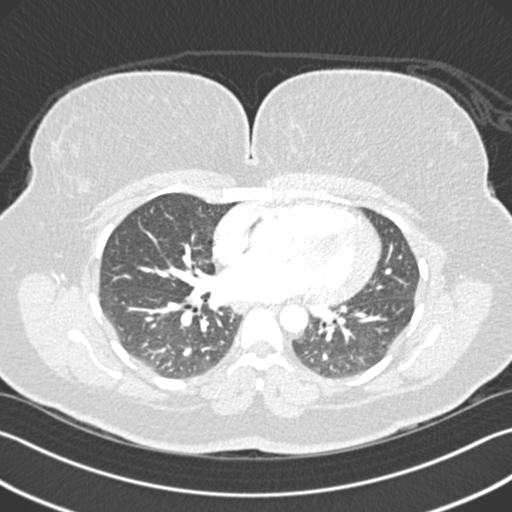
[im 149/284  mediastinal]
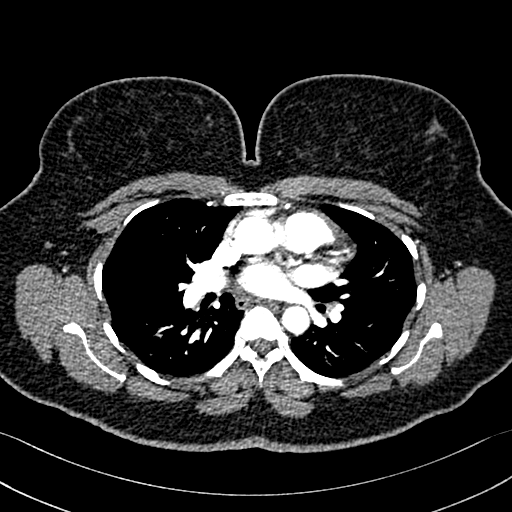
[im 164/284  lung]
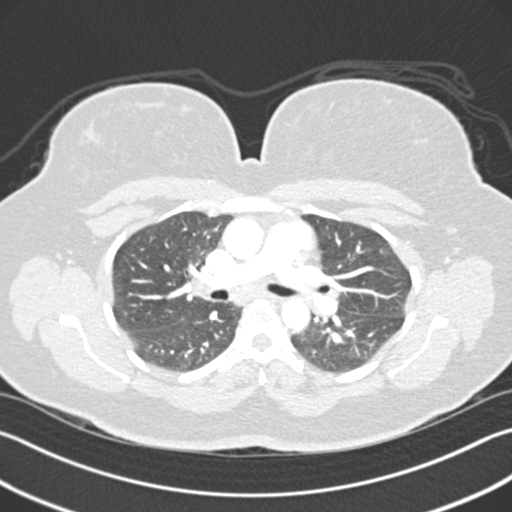
[im 179/284  mediastinal]
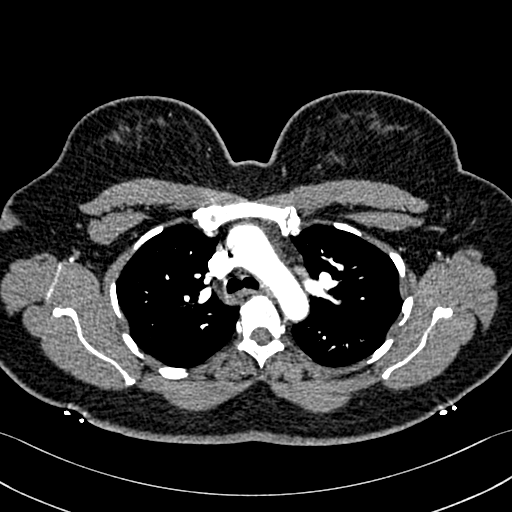
[im 194/284  lung]
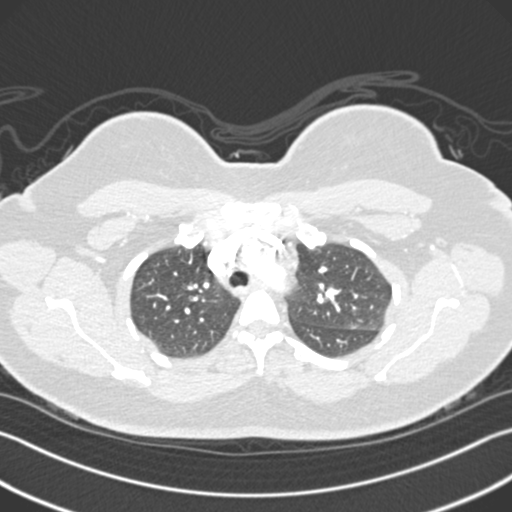
[im 209/284  mediastinal]
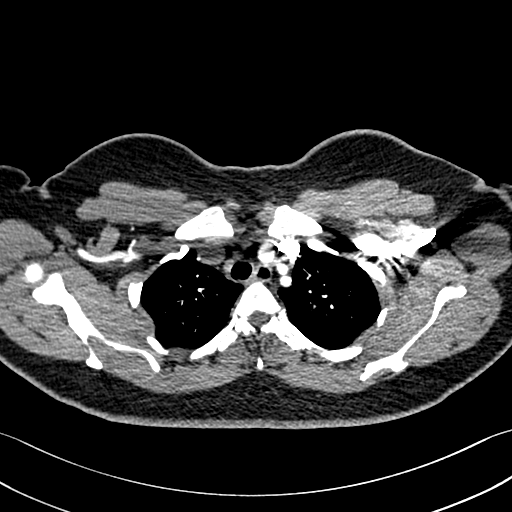
[im 224/284  lung]
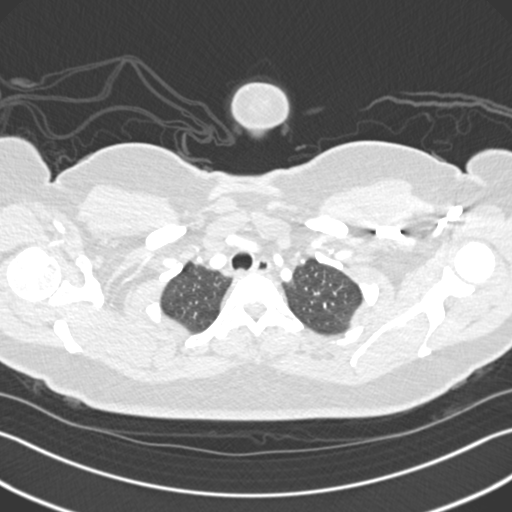
[im 239/284  mediastinal]
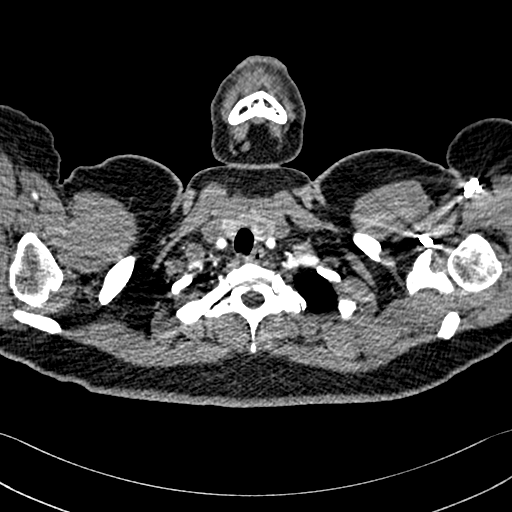
[im 254/284  lung]
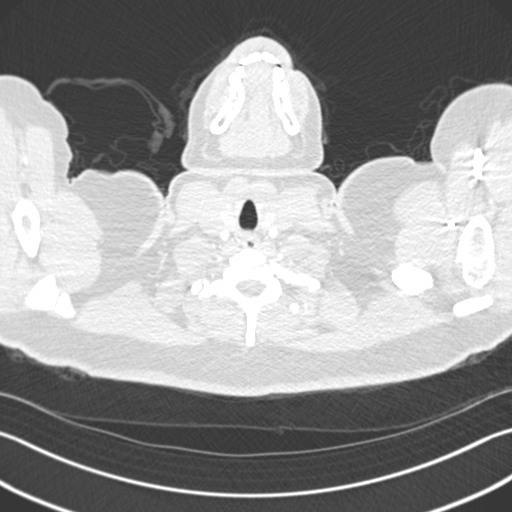
[im 269/284  mediastinal]
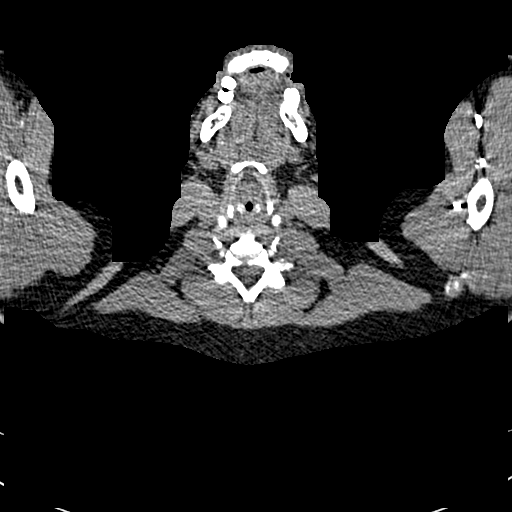

[19 of 36 positions shown; findings below may reference images not displayed]

FINDINGS: Cardiovascular: Contrast injection is sufficient to demonstrate
satisfactory opacification of the pulmonary arteries to the
segmental level. There is no pulmonary embolus or evidence of right
heart strain. The size of the main pulmonary artery is normal. Heart
size is normal, with no pericardial effusion. The course and caliber
of the aorta are normal. There is no atherosclerotic calcification.
Opacification decreased due to pulmonary arterial phase contrast
bolus timing.

Mediastinum/Nodes:

-- No mediastinal lymphadenopathy.

-- No hilar lymphadenopathy.

-- No axillary lymphadenopathy.

-- No supraclavicular lymphadenopathy.

--there is a 1.2 cm left-sided thyroid nodule. No followup
recommended (ref: [HOSPITAL]. [DATE]): 143-50).

-  Unremarkable esophagus.

Lungs/Pleura: Airways are patent. No pleural effusion, lobar
consolidation, pneumothorax or pulmonary infarction.

Upper Abdomen: Contrast bolus timing is not optimized for evaluation
of the abdominal organs. The visualized portions of the organs of
the upper abdomen are normal.

Musculoskeletal: No chest wall abnormality. No bony spinal canal
stenosis.

Review of the MIP images confirms the above findings.
IMPRESSION: No evidence of pulmonary embolism or other acute intrathoracic
process.

## 2021-09-30 ENCOUNTER — Encounter: Payer: Self-pay | Admitting: General Practice

## 2022-04-11 ENCOUNTER — Emergency Department (HOSPITAL_BASED_OUTPATIENT_CLINIC_OR_DEPARTMENT_OTHER)
Admission: EM | Admit: 2022-04-11 | Discharge: 2022-04-11 | Disposition: A | Discharge status: Home / Self Care | Payer: BLUE CROSS/BLUE SHIELD | Attending: Emergency Medicine | Admitting: Emergency Medicine

## 2022-04-11 ENCOUNTER — Emergency Department (HOSPITAL_BASED_OUTPATIENT_CLINIC_OR_DEPARTMENT_OTHER): Payer: BLUE CROSS/BLUE SHIELD

## 2022-04-11 ENCOUNTER — Encounter (HOSPITAL_BASED_OUTPATIENT_CLINIC_OR_DEPARTMENT_OTHER): Payer: Self-pay

## 2022-04-11 ENCOUNTER — Other Ambulatory Visit: Payer: Self-pay

## 2022-04-11 DIAGNOSIS — Z79899 Other long term (current) drug therapy: Secondary | ICD-10-CM | POA: Insufficient documentation

## 2022-04-11 DIAGNOSIS — J45909 Unspecified asthma, uncomplicated: Secondary | ICD-10-CM | POA: Insufficient documentation

## 2022-04-11 DIAGNOSIS — R519 Headache, unspecified: Secondary | ICD-10-CM | POA: Insufficient documentation

## 2022-04-11 DIAGNOSIS — I10 Essential (primary) hypertension: Secondary | ICD-10-CM | POA: Diagnosis not present

## 2022-04-11 DIAGNOSIS — S0990XA Unspecified injury of head, initial encounter: Secondary | ICD-10-CM

## 2022-04-11 NOTE — ED Provider Notes (Signed)
[facility] Provider Note   CSN: [id number] Arrival date & time: 04/11/22  1703     History  Chief Complaint  Patient presents with   Head Injury    Nicole Larson is a 47 y.o. female with history of hypertension, asthma who presents the emergency department after head injury.  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  The padding underneath the patient slipped, and she fell backwards striking her head on a light fixture behind her.  She denies any loss of consciousness.  She did have a "dazed feeling" right after hitting her head, and it took her a few seconds to feel normal again.  She continues to have a headache, worse in the back of her head where she hit it.  She denies any vision changes, nausea or vomiting.  She also says that her blood pressure has been slightly elevated today, last took her blood pressure medicine around 3 PM.  She is not on chronic anticoagulation.   Head Injury Associated symptoms: headache        Home Medications Prior to Admission medications   Medication Sig Start Date End Date Taking? Authorizing Provider  amLODipine (NORVASC) 10 MG tablet TAKE ONE TABLET BY MOUTH ONE TIME DAILY 11/09/20   [name], NP  amphetamine-dextroamphetamine (ADDERALL XR) 10 MG 24 hr capsule Take 1 capsule (10 mg total) by mouth daily. 06/29/21   [name], NP  busPIRone (BUSPAR) 15 MG tablet Take 15 mg by mouth 2 (two) times daily.    [provider]  FLUoxetine (PROZAC) 10 MG capsule Take 1 capsule (10 mg total) by mouth daily. 06/29/21   [name], NP  hydrOXYzine (VISTARIL) 25 MG capsule Take 1 capsule (25 mg total) by mouth every 8 (eight) hours as needed. 06/28/21   [name], NP  metoprolol succinate (TOPROL-XL) 50 MG 24 hr tablet TAKE ONE TABLET BY MOUTH ONE TIME DAILY *TAKE WITH OR IMMEDIATELY FOLLOWING A MEAL* 09/18/20    [name], NP  Multiple Vitamin (MULTIVITAMIN) tablet Take 1 tablet by mouth daily.    [provider]  OVER THE COUNTER MEDICATION BLACKSEED OIL.  Take 2 teaspoons a day with honey.    [provider]      Allergies    Lisinopril    Review of Systems   Review of Systems  Neurological:  Positive for headaches.  All other systems reviewed and are negative.   Physical Exam Updated Vital Signs BP (!) 178/111 (BP Location: Left Arm)   Pulse 93   Temp 98.3 F (36.8 C) (Oral)   Resp 15   Wt 70.3 kg   SpO2 (!) 9%   BMI 27.46 kg/m  Physical Exam Vitals and nursing note reviewed.  Constitutional:      Appearance: Normal appearance.  HENT:     Head: Normocephalic and atraumatic.     Comments: Mild tenderness noted to palpation of the occiput, without overlying skin changes Eyes:     Conjunctiva/sclera: Conjunctivae normal.  Pulmonary:     Effort: Pulmonary effort is normal. No respiratory distress.  Skin:    General: Skin is warm and dry.  Neurological:     Mental Status: She is alert.     Comments: Neuro: Speech is clear, able to follow commands. CN III-XII intact grossly intact. PERRLA. EOMI. Sensation intact throughout. Str 5/5 all extremities.  Psychiatric:  Mood and Affect: Mood normal.        Behavior: Behavior normal.     ED Results / Procedures / Treatments   Labs (all labs ordered are listed, but only abnormal results are displayed) Labs Reviewed - No data to display  EKG None  Radiology CT Head Wo Contrast  Result Date: 04/11/2022 CLINICAL DATA:  Head trauma EXAM: CT HEAD WITHOUT CONTRAST TECHNIQUE: Contiguous axial images were obtained from the base of the skull through the vertex without intravenous contrast. RADIATION DOSE REDUCTION: This exam was performed according to the departmental dose-optimization program which includes automated exposure control, adjustment of the mA and/or kV according to patient size and/or use  of iterative reconstruction technique. COMPARISON:  None Available. FINDINGS: Brain: No mass lesion, hemorrhage, hydrocephalus, acute infarct, intra-axial, or extra-axial fluid collection. Vascular: No hyperdense vessel or unexpected calcification. Skull: No significant soft tissue swelling.  Normal skull. Sinuses/Orbits: Normal imaged portions of the orbits and globes. Hypoplastic frontal sinuses. Clear mastoid air cells. Other: None. IMPRESSION: Normal head CT. Electronically Signed   By: Abigail Miyamoto M.D.   On: 04/11/2022 18:21    Procedures Procedures    Medications Ordered in ED Medications - No data to display  ED Course/ Medical Decision Making/ A&P                             Medical Decision Making Amount and/or Complexity of Data Reviewed Radiology: ordered.  This patient is a 47 y.o. female  who presents to the ED for concern of head injury.   Differential diagnoses prior to evaluation: The emergent differential diagnosis includes, but is not limited to,  Intracranial hemorrhage, concussion. This is not an exhaustive differential.   Past Medical History / Co-morbidities: hypertension, asthma  Physical Exam: Physical exam performed. The pertinent findings include: Hypertensive, otherwise normal vital signs.  Tenderness to palpation of the occiput without deformity or overlying skin changes.  Normal neurologic exam as above.  Lab Tests/Imaging studies: I personally interpreted labs/imaging and the pertinent results include: CT head without acute abnormalities.. I agree with the radiologist interpretation.  Disposition: After consideration of the diagnostic results and the patients response to treatment, I feel that emergency department workup does not suggest an emergent condition requiring admission or immediate intervention beyond what has been performed at this time. The plan is: discharge to home with reassurance that patient sustained mild head injury. Could have mild  concussion, but CT imaging and neurologic exam reassuring. The patient is safe for discharge and has been instructed to return immediately for worsening symptoms, change in symptoms or any other concerns.  Final Clinical Impression(s) / ED Diagnoses Final diagnoses:  Injury of head, initial encounter    Rx / DC Orders ED Discharge Orders     None      Portions of this report may have been transcribed using voice recognition software. Every effort was made to ensure accuracy; however, inadvertent computerized transcription errors may be present.    Kateri Plummer, PA-C 04/11/22 [place], [place], DO 04/11/22 1913

## 2022-04-11 NOTE — Discharge Instructions (Signed)
You were seen in the emergency department today for head injury.  As we discussed your CT imaging today looks normal.  It is possible you could have a mild concussion, and we diagnosis based off the symptoms that you are having.  I recommend taking ibuprofen and/or Tylenol as needed for pain.  If you have a headache, brain fog, sensitivity to light, or nausea, I would suspect you likely do have a concussion.  I would follow-up with your primary doctor if your symptoms persist longer than week.  Continue to monitor how you're doing and return to the ER for new or worsening symptoms.

## 2022-04-11 NOTE — ED Triage Notes (Addendum)
Pt arrives with c/o head injury while at work. Per pt, she hit her head on a light fixture. Pt denies LOC, but says she had a "dazed feeling" after she hit her head. Pt endorses headache. Pt denies n/v or vision changes. Pt ambulatory to triage. Pt has hx of high BP and took her BP med around 3pm today.

## 2022-04-11 NOTE — ED Notes (Signed)
Pt stable at time of discharge. PT A&OX4. RR even and unlabored. Pt verbalized understanding of discharge instructions as discussed. Pt ambulatory to lobby.

## 2022-04-12 ENCOUNTER — Telehealth: Payer: Self-pay

## 2022-04-12 NOTE — Transitions of Care (Post Inpatient/ED Visit) (Signed)
   04/12/2022  Name: Nicole Larson MRN: [id number] DOB: 1976/02/07  Today's TOC FU Call Status: Today's TOC FU Call Status:: Successful TOC FU Call Competed TOC FU Call Complete Date: 04/12/22  Transition Care Management Follow-up Telephone Call Date of Discharge: 04/11/22 Discharge Facility: [facility] Type of Discharge: Emergency Department Reason for ED Visit: Other: (Fall with head injury) How have you been since you were released from the hospital?: Better Any questions or concerns?: No  Items Reviewed: Did you receive and understand the discharge instructions provided?: Yes Medications obtained and verified?: No Medications Not Reviewed Reasons:: Other: (Pt driving at time of call) Any new allergies since your discharge?: No Dietary orders reviewed?: No Do you have support at home?: Yes People in Home: alone  Home Care and Equipment/Supplies: [place] Ordered?: No Any new equipment or medical supplies ordered?: No  Functional Questionnaire: Do you need assistance with bathing/showering or dressing?: No Do you need assistance with meal preparation?: No Do you need assistance with eating?: No Do you have difficulty maintaining continence: No Do you need assistance with getting out of bed/getting out of a chair/moving?: No Do you have difficulty managing or taking your medications?: No  Folllow up appointments reviewed: PCP Follow-up appointment confirmed?: No (Declines visit at this time) MD Provider Line [phone] Given: Yes [facility] Follow-up appointment confirmed?: NA Do you need transportation to your follow-up appointment?: No Do you understand care options if your condition(s) worsen?: Yes-patient verbalized understanding    SIGNATURE. Gerilyn Nestle

## 2022-04-13 ENCOUNTER — Ambulatory Visit (INDEPENDENT_AMBULATORY_CARE_PROVIDER_SITE_OTHER): Payer: BLUE CROSS/BLUE SHIELD | Admitting: Family

## 2022-04-13 ENCOUNTER — Encounter: Payer: Self-pay | Admitting: Family

## 2022-04-13 ENCOUNTER — Telehealth: Payer: Self-pay

## 2022-04-13 VITALS — BP 138/80 | HR 96 | Temp 98.0°F | Resp 16 | Wt 166.0 lb

## 2022-04-13 DIAGNOSIS — S060X0A Concussion without loss of consciousness, initial encounter: Secondary | ICD-10-CM | POA: Diagnosis not present

## 2022-04-13 DIAGNOSIS — F909 Attention-deficit hyperactivity disorder, unspecified type: Secondary | ICD-10-CM | POA: Diagnosis not present

## 2022-04-13 DIAGNOSIS — I10 Essential (primary) hypertension: Secondary | ICD-10-CM | POA: Diagnosis not present

## 2022-04-13 MED ORDER — ONDANSETRON HCL 4 MG PO TABS: 4.0000 mg | ORAL_TABLET | Freq: Three times a day (TID) | ORAL | 0 refills | Status: DC | PRN

## 2022-04-13 MED ORDER — METOPROLOL SUCCINATE ER 50 MG PO TB24: ORAL_TABLET | ORAL | 1 refills | Status: DC

## 2022-04-13 MED ORDER — AMLODIPINE BESYLATE 10 MG PO TABS: 10.0000 mg | ORAL_TABLET | Freq: Every day | ORAL | 1 refills | Status: DC

## 2022-04-13 NOTE — Assessment & Plan Note (Signed)
Uncontrolled. Restart metoprolol, continue amlodipine. Follow up in 1 month.

## 2022-04-13 NOTE — Assessment & Plan Note (Signed)
She did notice some improvement with adderall when she took it last spring. She is not currently taking it.  We can consider restarting once we get her blood pressure back under control.

## 2022-04-13 NOTE — Telephone Encounter (Signed)
Patient requesting note to go back to work Friday instead of Saturday. Letter created and emailed to patient at her request

## 2022-04-13 NOTE — Telephone Encounter (Signed)
Appt scheduled today w/ PCP.

## 2022-04-13 NOTE — Telephone Encounter (Signed)
Caller Name [place] Phone Number [phone] Patient Name Nicole Larson Patient DOB 1975-09-05 Call Type Message Only Information Provided Reason for Call Request for General Office Information Initial Comment Caller states that she would like to speak to the nurse for [name] Additional Comment Caller would like to speak to [place]. Time Disposition Final User 04/13/2022 12:29:22 PM General Information Provided Yes Virgil Benedict Call Closed By: Virgil Benedict Transaction Date/Time: 04/13/2022 12:25:43 PM (ET)

## 2022-04-13 NOTE — Telephone Encounter (Signed)
Initial Comment Caller was scheduled for an appt but also transferred here to speak to the nurse to see if she is good to wait until then. Sx include nausea. Translation No Nurse Assessment Nurse: Nicole Oiler, RN, Adriana Date/Time (Eastern Time): 04/13/2022 9:16:00 AM Confirm and document reason for call. If symptomatic, describe symptoms. ---pt states she had an accident at work on [employer]. was seen in the ED and told she had a concussion. was fine yesterday. today felt nauseous. had a slight headache that was brief about 40 min ago. has appt scheduled for today. Does the patient have any new or worsening symptoms? ---Yes Will a triage be completed? ---Yes Related visit to physician within the last 2 weeks? ---Yes Does the PT have any chronic conditions? (i.e. diabetes, asthma, this includes High risk factors for pregnancy, etc.) ---Yes List chronic conditions. ---htn Is the patient pregnant or possibly pregnant? (Ask all females between the ages of 42-55) ---No Is this a behavioral health or substance abuse call? ---No Guidelines Guideline Title Affirmed Question Affirmed Notes Nurse Date/Time (Eastern Time) Concussion (mTBI) Less Than 14 Days Ago Follow-up Call Concussion symptoms are worsening Crist Fat, Fabio Bering 04/13/2022 9:18:05 AM PLEASE NOTE: All timestamps contained within this report are represented as [place] Standard Time. CONFIDENTIALTY NOTICE: This fax transmission is intended only for the addressee. It contains information that is legally privileged, confidential or otherwise protected from use or disclosure. If you are not the intended recipient, you are strictly prohibited from reviewing, disclosing, copying using or disseminating any of this information or taking any action in reliance on or regarding this information. If you have received this fax in error, please notify us immediately by telephone so that we can arrange for its return to [place]. Phone: [phone],  Toll-Free: [phone], Fax: [phone] Page: 2 of 2 Call Id: [id number] [place]. Time Eilene Ghazi Time) Disposition Final User 04/13/2022 9:20:37 AM Go to ED Now (or PCP triage) Yes Nicole Oiler, RN, Adriana Final Disposition 04/13/2022 9:20:37 AM Go to ED Now (or PCP triage) Yes Nicole Oiler, RN, Adriana Caller Disagree/Comply Comply Caller Understands Yes PreDisposition Call Doctor Care Advice Given Per Guideline GO TO ED NOW (OR PCP TRIAGE): CALL BACK IF: * You become worse CARE ADVICE given per Concussion Follow-Up (Adult) guideline. Referrals REFERRED TO PCP OFFICE

## 2022-04-13 NOTE — Progress Notes (Signed)
Subjective:   By signing my name below, I, Shehryar Baig, attest that this documentation has been prepared under the direction and in the presence of [name], NP. 04/13/2022   Patient ID: Nicole Larson, female    DOB: Oct 25, 1975, 47 y.o.   MRN: [id number]  Chief Complaint  Patient presents with   Concussion    Was diagnosed with a concussion Monday at er after accident at work   Nausea    Patient reports feeling nauseated this morning   Headache    HPI Patient is in today for a ER follow up visit. ER records are reviewed.   ED follow up: She was admitted to the ED on 04/11/2022 for injury of the head. 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 She received a head CT without contrast and results were normal.  Since then she pain in her right temple. She has nausea this morning but did not vomit. She has returned to work today since her injury but does not feel comfortable returning. She would prefer to return to work Saturday 04/16/2022. She denies having any changes in her vision.   Blood pressure: She is requesting a refill for 10 mg amlodipine and 50 mg metoprolol succinate. She has been taking them sparingly recently due to her pharmacy not filling her recent prescription. She has taken her 10 mg amlodipine dose this morning but not her 50 mg metoprolol succinate.  BP Readings from Last 3 Encounters:  04/13/22 138/80  04/11/22 (!) 178/111  06/28/21 (!) 144/90   Pulse Readings from Last 3 Encounters:  04/13/22 96  04/11/22 93  06/28/21 98   Adderall: She has taken adderall in the past but stopped due to her elevated blood pressure. She is willing to start taking it again after her blood pressure is stable.    Past Medical History:  Diagnosis Date   Asthma    History of COVID-19 11/26/2019   Hypertension    Pneumonia    Upper airway cough syndrome 12/27/2019   Onset in Aug 2021 related to covid -  d/c acei 12/26/2019 with max rx for gerd     Past Surgical History:  Procedure Laterality Date   TUBAL LIGATION      Family History  Problem Relation Age of Onset   Sickle cell anemia Father    Diabetes Maternal Grandmother    Hypertension Maternal Grandmother    Diabetes Maternal Grandfather    Hypertension Maternal Grandfather    Diabetes Paternal Grandmother    Diabetes Paternal Grandfather     Social History   Socioeconomic History   Marital status: Divorced    Spouse name: Not on file   Number of children: Not on file   Years of education: Not on file   Highest education level: Not on file  Occupational History   Not on file  Tobacco Use   Smoking status: Never   Smokeless tobacco: Never  Vaping Use   Vaping Use: Never used  Substance and Sexual Activity   Alcohol use: Yes    Comment: occasionally throughout year   Drug use: No   Sexual activity: Not Currently    Birth control/protection: Surgical  Other Topics Concern   Not on file  Social History Narrative   Works as a Quarry manager at an agency   4 children (1 daughter and 3 sons) Her 3 sons live at home   Has one granddaughter  Happily Divorced   Enjoys spending time with her children, shopping, netflix, going out, vacation    Social Determinants of Health   Financial Resource Strain: Not on file  Food Insecurity: Not on file  Transportation Needs: Not on file  Physical Activity: Not on file  Stress: Not on file  Social Connections: Not on file  Intimate Partner Violence: Not on file    Outpatient Medications Prior to Visit  Medication Sig Dispense Refill   amphetamine-dextroamphetamine (ADDERALL XR) 10 MG 24 hr capsule Take 1 capsule (10 mg total) by mouth daily. 30 capsule 0   busPIRone (BUSPAR) 15 MG tablet Take 15 mg by mouth 2 (two) times daily.     FLUoxetine (PROZAC) 10 MG capsule Take 1 capsule (10 mg total) by mouth daily. 30 capsule 1   hydrOXYzine (VISTARIL) 25 MG capsule Take 1 capsule (25  mg total) by mouth every 8 (eight) hours as needed. 30 capsule 0   Multiple Vitamin (MULTIVITAMIN) tablet Take 1 tablet by mouth daily.     OVER THE COUNTER MEDICATION BLACKSEED OIL.  Take 2 teaspoons a day with honey.     amLODipine (NORVASC) 10 MG tablet TAKE ONE TABLET BY MOUTH ONE TIME DAILY 90 tablet 1   metoprolol succinate (TOPROL-XL) 50 MG 24 hr tablet TAKE ONE TABLET BY MOUTH ONE TIME DAILY *TAKE WITH OR IMMEDIATELY FOLLOWING A MEAL* 90 tablet 1   No facility-administered medications prior to visit.    Allergies  Allergen Reactions   Lisinopril     cough    Review of Systems  Eyes:        (-)changes in vision  Neurological:  Positive for headaches (right temple area).       Objective:    Physical Exam Constitutional:      General: She is not in acute distress.    Appearance: Normal appearance.  HENT:     Head: Normocephalic and atraumatic.     Right Ear: Tympanic membrane, ear canal and external ear normal.     Left Ear: Tympanic membrane, ear canal and external ear normal.     Mouth/Throat:     Mouth: Mucous membranes are moist.     Pharynx: Oropharynx is clear. No posterior oropharyngeal erythema.  Eyes:     Extraocular Movements: Extraocular movements intact.     Right eye: No nystagmus.     Left eye: No nystagmus.     Pupils: Pupils are equal, round, and reactive to light.  Cardiovascular:     Rate and Rhythm: Normal rate and regular rhythm.     Pulses: Normal pulses.     Heart sounds: Normal heart sounds. No murmur heard.    No gallop.  Pulmonary:     Effort: Pulmonary effort is normal. No respiratory distress.     Breath sounds: Normal breath sounds. No wheezing or rales.  Musculoskeletal:     Comments: 5/5 strength in both upper and lower extremities  Skin:    General: Skin is warm and dry.  Neurological:     Mental Status: She is alert and oriented to person, place, and time.     Cranial Nerves: No cranial nerve deficit.  Psychiatric:         Judgment: Judgment normal.     BP 138/80   Pulse 96   Temp 98 F (36.7 C) (Oral)   Resp 16   Wt 166 lb (75.3 kg)   SpO2 100%   BMI 29.41 kg/m  Wt Readings from  Last 3 Encounters:  04/13/22 166 lb (75.3 kg)  04/11/22 155 lb (70.3 kg)  06/28/21 168 lb (76.2 kg)       Assessment & Plan:  Attention deficit hyperactivity disorder (ADHD), unspecified ADHD type Assessment & Plan: She did notice some improvement with adderall when she took it last spring. She is not currently taking it.  We can consider restarting once we get her blood pressure back under control.    Essential hypertension Assessment & Plan: Uncontrolled. Restart metoprolol, continue amlodipine. Follow up in 1 month.   Concussion without loss of consciousness, initial encounter Assessment & Plan: No neuro deficits on exam. She is having HA and nausea.  I wrote her out of work until Saturday.  Add zofran prn nausea. Discussed typical course of concussion symptoms.    Other orders -     Ondansetron HCl; Take 1 tablet (4 mg total) by mouth every 8 (eight) hours as needed for nausea or vomiting.  Dispense: 20 tablet; Refill: 0 -     Metoprolol Succinate ER; TAKE ONE TABLET BY MOUTH ONE TIME DAILY *TAKE WITH OR IMMEDIATELY FOLLOWING A MEAL*  Dispense: 90 tablet; Refill: 1 -     amLODIPine Besylate; Take 1 tablet (10 mg total) by mouth daily.  Dispense: 90 tablet; Refill: 1    I, Nance Pear, NP, personally preformed the services described in this documentation.  All medical record entries made by the scribe were at my direction and in my presence.  I have reviewed the chart and discharge instructions (if applicable) and agree that the record reflects my personal performance and is accurate and complete. 04/13/2022   I,Shehryar Baig,acting as a scribe for Nance Pear, NP.,have documented all relevant documentation on the behalf of Nance Pear, NP,as directed by  Nance Pear, NP while  in the presence of Nance Pear, NP.   Nance Pear, NP

## 2022-04-13 NOTE — Assessment & Plan Note (Signed)
No neuro deficits on exam. She is having HA and nausea.  I wrote her out of work until Saturday.  Add zofran prn nausea. Discussed typical course of concussion symptoms.

## 2022-04-14 ENCOUNTER — Telehealth: Payer: Self-pay | Admitting: Family

## 2022-04-14 NOTE — Telephone Encounter (Signed)
Pt wanted to ask if diarrhea was a symptom of her concussion. Please Advise.

## 2022-04-15 NOTE — Telephone Encounter (Signed)
Pt would like a call back from [place]. She said she is taking ibuprofen but it is only lessening the pain but the headache is not going away entirely. Pt also said she has paperwork that her employer needs Earlie Counts to fill out.

## 2022-04-15 NOTE — Telephone Encounter (Signed)
Patient advised, per provider, diarrhea is not a symptom of concussion. She reports having loose stools a few times now. She will try some otc meds for this

## 2022-04-15 NOTE — Telephone Encounter (Signed)
Patient advised ok per pcp work note will be extended and emailed to her at her request

## 2022-04-15 NOTE — Telephone Encounter (Signed)
Pt is requesting a work note writing her out of work until Sunday. She said her son was playing his game while talking to her and the noise was extra loud so she knows she could not handle working or anything else. Please email it to her [email or web address]

## 2022-04-15 NOTE — Telephone Encounter (Signed)
Patient advised, ok per provider, to take up to 600 mg of ibuprofen q 6 hours for no more than 2 days. She can also take tylenol. She will bring the paperwork from worker's comp to the office today

## 2022-04-18 ENCOUNTER — Encounter: Payer: Self-pay | Admitting: Family

## 2022-04-20 ENCOUNTER — Other Ambulatory Visit: Payer: Self-pay | Admitting: Family

## 2022-04-20 DIAGNOSIS — S060X0D Concussion without loss of consciousness, subsequent encounter: Secondary | ICD-10-CM

## 2022-04-21 NOTE — Progress Notes (Deleted)
Subjective:   I, Nicole Larson, LAT, ATC acting as a scribe for Lynne Leader, MD.  Chief Complaint: Nicole Larson,  is a 47 y.o. female who presents for initial evaluation of a head injury. 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 No LOC. Pt was seen at the [facility] ED following the incident. Today, pt reports  Dx imaging: 04/11/22 Head CT  Injury date : 04/11/22 Visit #: 1  History of Present Illness:   Concussion Self-Reported Symptom Score Symptoms rated on a scale 1-6, in last 24 hours  Headache: ***   Pressure in head: *** Neck pain: *** Nausea or vomiting: *** Dizziness: ***  Blurred vision: ***  Balance problems: *** Sensitivity to light:  *** Sensitivity to noise: *** Feeling slowed down: *** Feeling like "in a fog": *** "Don't feel right": *** Difficulty concentrating: *** Difficulty remembering: *** Fatigue or low energy: *** Confusion: *** Drowsiness: *** More emotional: *** Irritability: *** Sadness: *** Nervous or anxious: *** Trouble falling asleep: ***   Total # of Symptoms: *** Total Symptom Score: ***  Tinnitus: Yes/No  Review of Systems:  ***    Review of History: ***  Objective:    Physical Examination There were no vitals filed for this visit. MSK:  *** Neuro: *** Psych: ***     Imaging:  ***  Assessment and Plan   47 y.o. female with ***    ***    Action/Discussion: Reviewed diagnosis, management options, expected outcomes, and the reasons for scheduled and emergent follow-up. Questions were adequately answered. Patient expressed verbal understanding and agreement with the following plan.     Patient Education: Reviewed with patient the risks (i.e, a repeat concussion, post-concussion syndrome, second-impact syndrome) of returning to play prior to complete resolution, and  thoroughly reviewed the signs and symptoms of concussion.Reviewed need for complete resolution of all symptoms, with rest AND exertion, prior to return to play. Reviewed red flags for urgent medical evaluation: worsening symptoms, nausea/vomiting, intractable headache, musculoskeletal changes, focal neurological deficits. [facility] Concussion Care Plan, which clearly outlines the plans stated above, was given to patient.   Level of service: ***     After Visit Summary printed out and provided to patient as appropriate.  The above documentation has been reviewed and is accurate and complete Mare Ferrari

## 2022-04-22 ENCOUNTER — Encounter: Payer: BLUE CROSS/BLUE SHIELD | Admitting: Family Medicine

## 2022-05-11 ENCOUNTER — Ambulatory Visit: Payer: BLUE CROSS/BLUE SHIELD | Admitting: Family

## 2023-03-08 ENCOUNTER — Ambulatory Visit: Payer: BLUE CROSS/BLUE SHIELD | Admitting: Family

## 2023-04-05 ENCOUNTER — Telehealth (INDEPENDENT_AMBULATORY_CARE_PROVIDER_SITE_OTHER): Payer: Commercial Managed Care - HMO | Admitting: Family

## 2023-04-05 DIAGNOSIS — F419 Anxiety disorder, unspecified: Secondary | ICD-10-CM | POA: Diagnosis not present

## 2023-04-05 DIAGNOSIS — G4700 Insomnia, unspecified: Secondary | ICD-10-CM | POA: Diagnosis not present

## 2023-04-05 DIAGNOSIS — F32A Depression, unspecified: Secondary | ICD-10-CM

## 2023-04-05 DIAGNOSIS — S29019A Strain of muscle and tendon of unspecified wall of thorax, initial encounter: Secondary | ICD-10-CM

## 2023-04-05 DIAGNOSIS — I10 Essential (primary) hypertension: Secondary | ICD-10-CM

## 2023-04-05 DIAGNOSIS — F909 Attention-deficit hyperactivity disorder, unspecified type: Secondary | ICD-10-CM | POA: Diagnosis not present

## 2023-04-05 DIAGNOSIS — S39012A Strain of muscle, fascia and tendon of lower back, initial encounter: Secondary | ICD-10-CM

## 2023-04-05 MED ORDER — ALPRAZOLAM 0.5 MG PO TABS: ORAL_TABLET | ORAL | 0 refills | Status: DC

## 2023-04-05 MED ORDER — BUPROPION HCL ER (XL) 150 MG PO TB24: 150.0000 mg | ORAL_TABLET | Freq: Every day | ORAL | 1 refills | Status: DC

## 2023-04-05 MED ORDER — HYDROXYZINE HCL 10 MG PO TABS
10.0000 mg | ORAL_TABLET | Freq: Every evening | ORAL | 1 refills | Status: DC | PRN
Start: 2023-04-05 — End: 2023-04-26

## 2023-04-05 MED ORDER — MELOXICAM 7.5 MG PO TABS: 7.5000 mg | ORAL_TABLET | Freq: Every day | ORAL | 0 refills | Status: DC

## 2023-04-05 NOTE — Assessment & Plan Note (Signed)
 Plan to update contract and retrial medication after next face to face visit in 2 weeks.

## 2023-04-05 NOTE — Assessment & Plan Note (Addendum)
Uncontrolled, especially anxious about getting on a plane to take her son to a college recruitment in [facility]. Provided xanax prn for flying.

## 2023-04-05 NOTE — Assessment & Plan Note (Deleted)
 New. Rx with meloxicam . I have written her out of work tonight to recover.

## 2023-04-05 NOTE — Assessment & Plan Note (Signed)
 Restart hydroxyzine  HS at a lower dose as she found it helpful previously but felt it to be too strong.
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
## 2023-04-05 NOTE — Patient Instructions (Signed)
 VISIT SUMMARY:  Today, we discussed your concerns about anxiety, depression, shoulder pain, and hypertension. We reviewed your symptoms and previous treatments, and we have made some adjustments to your medications and provided additional recommendations to help manage your conditions.  YOUR PLAN:  -DEPRESSION AND ANXIETY: Depression and anxiety are mental health conditions that can cause feelings of sadness, worry, and difficulty sleeping. We will start you on Wellbutrin  in the morning for depression and continue Hydroxyzine  at bedtime as needed for sleep. Counseling is also recommended, and information for the Employee Assistance Program and [facility] has been provided.  -ACUTE BACK/SHOULDER PAIN: We have prescribed an anti-inflammatory medication to be taken once daily and advised you to take a day off work for rest and recovery.  -HYPERTENSION: Hypertension, or high blood pressure, is a condition where the force of the blood against your artery walls is too high. You should check your blood pressure at home and report the readings via MyChart.  -FLIGHT ANXIETY: Flight anxiety is a fear of flying that can cause significant distress. We have prescribed Xanax  to be taken 30 minutes before your flight, with the option for a second dose if you are still anxious.  INSTRUCTIONS:  Please schedule an appointment in two weeks to check your blood pressure and assess your response to the new depression medication. We will also consider updating your controlled substance contract for a potential Adderall prescription at your next visit.

## 2023-04-05 NOTE — Assessment & Plan Note (Signed)
 Uncontrolled. Has had multiple drug intolerances previously. Will give trial of wellbutrin . Recommended that she establish with a counselor as well.

## 2023-04-05 NOTE — Assessment & Plan Note (Signed)
 New. Rx with meloxicam . I have written her out of work tonight to recover.

## 2023-04-05 NOTE — Assessment & Plan Note (Signed)
 Not checking BP at home.  Recheck when she comes in 2 weeks.

## 2023-04-26 ENCOUNTER — Ambulatory Visit: Payer: Commercial Managed Care - HMO | Admitting: Family

## 2023-04-26 ENCOUNTER — Encounter: Payer: Self-pay | Admitting: Family

## 2023-04-26 VITALS — BP 155/90 | HR 93 | Temp 98.8°F | Resp 16 | Ht 62.0 in | Wt 172.0 lb

## 2023-04-26 DIAGNOSIS — G4700 Insomnia, unspecified: Secondary | ICD-10-CM | POA: Diagnosis not present

## 2023-04-26 DIAGNOSIS — F419 Anxiety disorder, unspecified: Secondary | ICD-10-CM | POA: Diagnosis not present

## 2023-04-26 DIAGNOSIS — F32A Depression, unspecified: Secondary | ICD-10-CM

## 2023-04-26 DIAGNOSIS — M79644 Pain in right finger(s): Secondary | ICD-10-CM | POA: Insufficient documentation

## 2023-04-26 DIAGNOSIS — I10 Essential (primary) hypertension: Secondary | ICD-10-CM | POA: Diagnosis not present

## 2023-04-26 LAB — COMPREHENSIVE METABOLIC PANEL
ALT: 21 U/L (ref 0–35)
AST: 16 U/L (ref 0–37)
Albumin: 4 g/dL (ref 3.5–5.2)
Alkaline Phosphatase: 45 U/L (ref 39–117)
BUN: 14 mg/dL (ref 6–23)
CO2: 28 meq/L (ref 19–32)
Calcium: 8.9 mg/dL (ref 8.4–10.5)
Chloride: 105 meq/L (ref 96–112)
Creatinine, Ser: 1 mg/dL (ref 0.40–1.20)
GFR: 68.17 mL/min (ref >60.00)
Glucose, Bld: 82 mg/dL (ref 70–99)
Potassium: 4 meq/L (ref 3.5–5.1)
Sodium: 139 meq/L (ref 135–145)
Total Bilirubin: 0.3 mg/dL (ref 0.2–1.2)
Total Protein: 7 g/dL (ref 6.0–8.3)

## 2023-04-26 MED ORDER — HYDROXYZINE HCL 10 MG PO TABS: 10.0000 mg | ORAL_TABLET | Freq: Every evening | ORAL | 1 refills | Status: AC | PRN

## 2023-04-26 MED ORDER — ALPRAZOLAM 0.5 MG PO TABS: ORAL_TABLET | ORAL | 0 refills | Status: DC

## 2023-04-26 MED ORDER — MELOXICAM 7.5 MG PO TABS: 7.5000 mg | ORAL_TABLET | Freq: Every day | ORAL | 0 refills | Status: DC

## 2023-04-26 MED ORDER — AMLODIPINE BESYLATE 10 MG PO TABS: 10.0000 mg | ORAL_TABLET | Freq: Every day | ORAL | 1 refills | Status: DC

## 2023-04-26 MED ORDER — BUPROPION HCL ER (XL) 150 MG PO TB24: 150.0000 mg | ORAL_TABLET | Freq: Every day | ORAL | 1 refills | Status: DC

## 2023-04-26 MED ORDER — METOPROLOL SUCCINATE ER 50 MG PO TB24: ORAL_TABLET | ORAL | 1 refills | Status: AC

## 2023-04-26 NOTE — Assessment & Plan Note (Signed)
 New. Recommended OTC thumb splint, trial of meloxicam.

## 2023-04-26 NOTE — Assessment & Plan Note (Signed)
 Improved on wellbutrin. Continue same.

## 2023-04-26 NOTE — Assessment & Plan Note (Signed)
 Continues to find benefit from hydroxyzine.

## 2023-04-26 NOTE — Assessment & Plan Note (Addendum)
 Stable with prn use of xanax. Wishes to continue sparingly, contract signed, will obtain UDS. Reviewed [place] Controlled substance registry.

## 2023-04-26 NOTE — Patient Instructions (Signed)
 VISIT SUMMARY:  During our visit, we discussed your anxiety, sleep disturbances, and hypertension. We also talked about the stress you're experiencing due to your son's upcoming departure for college. We reviewed your current medications and made some adjustments to help manage your symptoms better.  YOUR PLAN:  -ANXIETY: Anxiety is a feeling of unease, such as worry or fear, that can be mild or severe. We will continue your current medication, Wellbutrin, and Xanax, which you've found helpful for managing your anxiety. However, to reduce the feeling of sedation you've been experiencing, we discussed splitting your Xanax dose. You'll also need to sign a controlled substance contract and complete a urine drug screen to continue your Xanax prescription.  -INSOMNIA: Insomnia is a sleep disorder that can make it hard to fall asleep, hard to stay asleep, or cause you to wake up too early and not be able to get back to sleep. We will continue your current medication, Hydroxyzine, which you've found effective for managing your sleep without causing daytime grogginess.  -HYPERTENSION: Hypertension, or high blood pressure, is a condition where the force of the blood against the artery walls is too high. We will refill your Metoprolol prescription to help manage your blood pressure.  INSTRUCTIONS:  Please check if any blood work is needed today. Remember to sign the controlled substance contract and complete the urine drug screen to continue your Xanax prescription. Also, make sure to refill your Metoprolol prescription for your hypertension.

## 2023-04-26 NOTE — Progress Notes (Signed)
 Subjective:     Patient ID: Nicole Larson, female    DOB: [birth year], 48 y.o.   MRN: [id number]  Chief Complaint  Patient presents with   Depression    Here for follow up   Hypertension    Here for follow up    Hand Pain    Complains of pain from thumb to wrist, right hand    Depression       Hypertension  Hand Pain     Discussed the use of AI scribe software for clinical note transcription with the patient, who gave verbal consent to proceed.  History of Present Illness  Patient presents today for follow up. Last visit Nicole Larson was noted to be depressed and anxious and we started her on wellburin xl 150mg . Nicole Larson notes feeling much better on the wellbutrin.  Still emotional at times related to her son going to college, but handling things better. Nicole Larson did use the xanax prior to her flight which was very helpful. Nicole Larson notes occasional panic attacks and wants something to take if that happens.   Nicole Larson is out of toprol xl.    Notes 2 day history of significant right thumb pain.      Health Maintenance Due  Topic Date Due   Pneumococcal Vaccine 17-69 Years old (1 of 2 - PCV) Never done   Hepatitis C Screening  Never done   INFLUENZA VACCINE  09/29/2022   COVID-19 Vaccine (3 - 2024-25 season) 10/30/2022   Colonoscopy  Never done    Past Medical History:  Diagnosis Date   Asthma    History of COVID-19 11/26/2019   Hypertension    Pneumonia    Upper airway cough syndrome 12/27/2019   Onset in Aug 2021 related to covid - d/c acei 12/26/2019 with max rx for gerd     Past Surgical History:  Procedure Laterality Date   TUBAL LIGATION      Family History  Problem Relation Age of Onset   Sickle cell anemia Father    Diabetes Maternal Grandmother    Hypertension Maternal Grandmother    Diabetes Maternal Grandfather    Hypertension Maternal Grandfather    Diabetes Paternal Grandmother    Diabetes Paternal Grandfather     Social History   Socioeconomic History    Marital status: Divorced    Spouse name: Not on file   Number of children: Not on file   Years of education: Not on file   Highest education level: Not on file  Occupational History   Not on file  Tobacco Use   Smoking status: Never   Smokeless tobacco: Never  Vaping Use   Vaping status: Never Used  Substance and Sexual Activity   Alcohol use: Yes    Comment: occasionally throughout year   Drug use: No   Sexual activity: Not Currently    Birth control/protection: Surgical  Other Topics Concern   Not on file  Social History Narrative   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   4 children (1 daughter and 3 sons) Her 3 sons live at home   Has one granddaughter   Happily Divorced   Enjoys spending time with her children, shopping, netflix, going out, vacation    Social Drivers of Corporate investment banker Strain: Not on file  Food Insecurity: Not on file  Transportation Needs: Not on file  Physical Activity: Not on file  Stress: Not on file  Social Connections: Unknown (11/09/2022)   Received from [facility]  Social Network    Social Network: Not on file  Intimate Partner Violence: Not At Risk (11/09/2022)   Received from [facility]   HITS    Over the last 12 months how often did your partner physically hurt you?: Never    Over the last 12 months how often did your partner insult you or talk down to you?: Never    Over the last 12 months how often did your partner threaten you with physical harm?: Never    Over the last 12 months how often did your partner scream or curse at you?: Never    Outpatient Medications Prior to Visit  Medication Sig Dispense Refill   Multiple Vitamin (MULTIVITAMIN) tablet Take 1 tablet by mouth daily.     ondansetron (ZOFRAN) 4 MG tablet Take 1 tablet (4 mg total) by mouth every 8 (eight) hours as needed for nausea or vomiting. 20 tablet 0   OVER THE COUNTER MEDICATION BLACKSEED OIL.  Take 2 teaspoons a day with honey.     amLODipine (NORVASC) 10  MG tablet Take 1 tablet (10 mg total) by mouth daily. 90 tablet 1   buPROPion (WELLBUTRIN XL) 150 MG 24 hr tablet Take 1 tablet (150 mg total) by mouth daily. 30 tablet 1   hydrOXYzine (ATARAX) 10 MG tablet Take 1 tablet (10 mg total) by mouth at bedtime as needed (insomnia). 30 tablet 1   metoprolol succinate (TOPROL-XL) 50 MG 24 hr tablet TAKE ONE TABLET BY MOUTH ONE TIME DAILY *TAKE WITH OR IMMEDIATELY FOLLOWING A MEAL* 90 tablet 1   ALPRAZolam (XANAX) 0.5 MG tablet Take 1-2 tablets by mouth prior to flying 6 tablet 0   meloxicam (MOBIC) 7.5 MG tablet Take 1 tablet (7.5 mg total) by mouth daily. 14 tablet 0   No facility-administered medications prior to visit.    Allergies  Allergen Reactions   Cymbalta [Duloxetine Hcl]    Lisinopril     cough    Review of Systems  Psychiatric/Behavioral:  Positive for depression.        Objective:    Physical Exam Constitutional:      Appearance: Nicole Larson is well-developed.  Cardiovascular:     Rate and Rhythm: Normal rate and regular rhythm.     Heart sounds: Normal heart sounds. No murmur heard. Pulmonary:     Effort: Pulmonary effort is normal. No respiratory distress.     Breath sounds: Normal breath sounds. No wheezing.  Musculoskeletal:     Comments: Right thumb is without swelling.   Psychiatric:        Behavior: Behavior normal.        Thought Content: Thought content normal.        Judgment: Judgment normal.      BP (!) 155/90   Pulse 93   Temp 98.8 F (37.1 C) (Oral)   Resp 16   Ht 5\' 2"  (1.575 m)   Wt 172 lb (78 kg)   SpO2 99%   BMI 31.46 kg/m  Wt Readings from Last 3 Encounters:  04/26/23 172 lb (78 kg)  04/13/22 166 lb (75.3 kg)  04/11/22 155 lb (70.3 kg)       Assessment & Plan:   Problem List Items Addressed This Visit       Unprioritized   Pain of right thumb   New. Recommended OTC thumb splint, trial of meloxicam.       Relevant Medications   meloxicam (MOBIC) 7.5 MG tablet   Insomnia  Continues to find benefit from hydroxyzine.       Relevant Medications   hydrOXYzine (ATARAX) 10 MG tablet   Essential hypertension   BP Readings from Last 3 Encounters:  04/26/23 (!) 143/95  04/13/22 138/80  04/11/22 (!) 178/111   Ran out of toprol xl. Restart along with amlodipine. Repeat BP in 1 month.       Relevant Medications   metoprolol succinate (TOPROL-XL) 50 MG 24 hr tablet   amLODipine (NORVASC) 10 MG tablet   Other Relevant Orders   Comp Met (CMET)   Depression   Improved on wellbutrin. Continue same.       Relevant Medications   ALPRAZolam (XANAX) 0.5 MG tablet   buPROPion (WELLBUTRIN XL) 150 MG 24 hr tablet   hydrOXYzine (ATARAX) 10 MG tablet   Anxiety - Primary   Stable with prn use of xanax. Wishes to continue sparingly, contract signed, will obtain UDS. Reviewed [place] Controlled substance registry.       Relevant Medications   ALPRAZolam (XANAX) 0.5 MG tablet   buPROPion (WELLBUTRIN XL) 150 MG 24 hr tablet   hydrOXYzine (ATARAX) 10 MG tablet   Other Relevant Orders   DRUG MONITORING, PANEL 8 WITH CONFIRMATION, URINE    I have discontinued Mckena Corvino's meloxicam. I have also changed her ALPRAZolam. Additionally, I am having her start on meloxicam. Lastly, I am having her maintain her multivitamin, OVER THE COUNTER MEDICATION, ondansetron, metoprolol succinate, buPROPion, amLODipine, and hydrOXYzine.  Meds ordered this encounter  Medications   ALPRAZolam (XANAX) 0.5 MG tablet    Sig: Take 1 tablet by mouth twice daily as needed    Dispense:  30 tablet    Refill:  0    Supervising Provider:   Danise Edge A [[id number]]   metoprolol succinate (TOPROL-XL) 50 MG 24 hr tablet    Sig: TAKE ONE TABLET BY MOUTH ONE TIME DAILY *TAKE WITH OR IMMEDIATELY FOLLOWING A MEAL*    Dispense:  90 tablet    Refill:  1    Supervising Provider:   Danise Edge A [[id number]]   buPROPion (WELLBUTRIN XL) 150 MG 24 hr tablet    Sig: Take 1 tablet (150 mg total) by mouth daily.     Dispense:  90 tablet    Refill:  1    Supervising Provider:   Danise Edge A [[id number]]   amLODipine (NORVASC) 10 MG tablet    Sig: Take 1 tablet (10 mg total) by mouth daily.    Dispense:  90 tablet    Refill:  1    Supervising Provider:   Danise Edge A [[id number]]   hydrOXYzine (ATARAX) 10 MG tablet    Sig: Take 1 tablet (10 mg total) by mouth at bedtime as needed (insomnia).    Dispense:  90 tablet    Refill:  1    Supervising Provider:   Danise Edge A [[id number]]   meloxicam (MOBIC) 7.5 MG tablet    Sig: Take 1 tablet (7.5 mg total) by mouth daily.    Dispense:  14 tablet    Refill:  0    Supervising Provider:   Danise Edge A [[id number]]

## 2023-04-26 NOTE — Assessment & Plan Note (Addendum)
 BP Readings from Last 3 Encounters:  04/26/23 (!) 143/95  04/13/22 138/80  04/11/22 (!) 178/111   Ran out of toprol xl. Restart along with amlodipine. Repeat BP in 1 month.

## 2023-04-27 LAB — DRUG MONITORING, PANEL 8 WITH CONFIRMATION, URINE
6 Acetylmorphine: NEGATIVE ng/mL (ref <10)
Alcohol Metabolites: NEGATIVE ng/mL (ref <500)
Amphetamines: NEGATIVE ng/mL (ref <500)
Benzodiazepines: NEGATIVE ng/mL (ref <100)
Buprenorphine, Urine: NEGATIVE ng/mL (ref <5)
Cocaine Metabolite: NEGATIVE ng/mL (ref <150)
Creatinine: 168.9 mg/dL (ref >=20.0)
MDMA: NEGATIVE ng/mL (ref <500)
Marijuana Metabolite: NEGATIVE ng/mL (ref <20)
Opiates: NEGATIVE ng/mL (ref <100)
Oxidant: NEGATIVE ug/mL (ref <200)
Oxycodone: NEGATIVE ng/mL (ref <100)
pH: 5.5 (ref 4.5–9.0)

## 2023-04-27 LAB — DM TEMPLATE

## 2023-06-21 ENCOUNTER — Ambulatory Visit: Admitting: Family

## 2023-06-27 ENCOUNTER — Ambulatory Visit: Admitting: Family

## 2023-06-27 VITALS — BP 158/85 | HR 95 | Resp 16 | Ht 62.0 in | Wt 175.0 lb

## 2023-06-27 DIAGNOSIS — I10 Essential (primary) hypertension: Secondary | ICD-10-CM

## 2023-06-27 DIAGNOSIS — Z1211 Encounter for screening for malignant neoplasm of colon: Secondary | ICD-10-CM | POA: Diagnosis not present

## 2023-06-27 DIAGNOSIS — F32A Depression, unspecified: Secondary | ICD-10-CM | POA: Diagnosis not present

## 2023-06-27 DIAGNOSIS — F419 Anxiety disorder, unspecified: Secondary | ICD-10-CM

## 2023-06-27 MED ORDER — HYDROCHLOROTHIAZIDE 25 MG PO TABS: 25.0000 mg | ORAL_TABLET | Freq: Every day | ORAL | 0 refills | Status: DC

## 2023-06-27 MED ORDER — BUPROPION HCL ER (XL) 300 MG PO TB24: 300.0000 mg | ORAL_TABLET | Freq: Every day | ORAL | 0 refills | Status: DC

## 2023-06-27 MED ORDER — POTASSIUM CHLORIDE CRYS ER 10 MEQ PO TBCR: 10.0000 meq | EXTENDED_RELEASE_TABLET | Freq: Every day | ORAL | 0 refills | Status: AC

## 2023-06-27 MED ORDER — ALPRAZOLAM 0.5 MG PO TABS: ORAL_TABLET | ORAL | 0 refills | Status: DC

## 2023-06-27 NOTE — Assessment & Plan Note (Signed)
 Continues xanax  prn. Contract and UDS up to date.

## 2023-06-27 NOTE — Assessment & Plan Note (Signed)
  Hypertension uncontrolled on amlodipine  and Toprol  XL. Night shift work may affect medication timing. Emphasized maintaining BP <140/90 to prevent complications. Discussed lifestyle modifications. - Add hydrochlorothiazide . - Continue amlodipine  and Toprol  XL. - Recheck BP and potassium in one week. - Encourage weight loss through diet and exercise. - Advise replacing sugary drinks with water. - Monitor BP daily at home.

## 2023-06-27 NOTE — Progress Notes (Signed)
 Subjective:     Patient ID: Nicole Larson, female    DOB: 22-Oct-1975, 48 y.o.   MRN: [id number]  Chief Complaint  Patient presents with   Hypertension    Follow up for medication    Anxiety    Follow up for medication     Hypertension Associated symptoms include anxiety.  Anxiety      Discussed the use of AI scribe software for clinical note transcription with the patient, who gave verbal consent to proceed.  History of Present Illness Nicole Larson is a 48 year old female with hypertension and mood disorder who presents for blood pressure management and mood stabilization.  She is on amlodipine  10 mg and Toprol  XL once daily for hypertension. She recently adjusted her medication timing to mornings due to her night shift work schedule. She is concerned about elevated blood pressure readings and weight gain since her last check, which she believes may contribute to her hypertension.  She experiences mood fluctuations and takes Wellbutrin . Her mood is more stable with medication, though she still feels some sadness, particularly about her son going to college.  She previously took a potassium supplement due to low potassium levels when on a diuretic. Her night shift work affects her eating and sleeping patterns. She drinks Pepsi instead of water and lacks exercise, contributing to her weight concerns. She uses hydroxyzine  for sleep, which is effective without causing morning drowsiness.      Health Maintenance Due  Topic Date Due   Hepatitis C Screening  Never done   Pneumococcal Vaccine 13-60 Years old (1 of 2 - PCV) Never done   COVID-19 Vaccine (3 - 2024-25 season) 10/30/2022   Colonoscopy  Never done    Past Medical History:  Diagnosis Date   Asthma    History of COVID-19 11/26/2019   Hypertension    Pneumonia    Upper airway cough syndrome 12/27/2019   Onset in Aug 2021 related to covid - d/c acei 12/26/2019 with max rx for gerd     Past Surgical  History:  Procedure Laterality Date   TUBAL LIGATION      Family History  Problem Relation Age of Onset   Sickle cell anemia Father    Diabetes Maternal Grandmother    Hypertension Maternal Grandmother    Diabetes Maternal Grandfather    Hypertension Maternal Grandfather    Diabetes Paternal Grandmother    Diabetes Paternal Grandfather     Social History   Socioeconomic History   Marital status: Divorced    Spouse name: Not on file   Number of children: Not on file   Years of education: Not on file   Highest education level: Not on file  Occupational History   Not on file  Tobacco Use   Smoking status: Never   Smokeless tobacco: Never  Vaping Use   Vaping status: Never Used  Substance and Sexual Activity   Alcohol use: Yes    Comment: occasionally throughout year   Drug use: No   Sexual activity: Not Currently    Birth control/protection: Surgical  Other Topics Concern   Not on file  Social History Narrative   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   4 children (1 daughter and 3 sons) Her 3 sons live at home   Has one granddaughter   Happily Divorced   Enjoys spending time with her children, shopping, netflix, going out, vacation    Social Drivers of Corporate investment banker Strain: Not  on file  Food Insecurity: Not on file  Transportation Needs: Not on file  Physical Activity: Not on file  Stress: Not on file  Social Connections: Unknown (11/09/2022)   Received from [facility]   Social Network    Social Network: Not on file  Intimate Partner Violence: Not At Risk (11/09/2022)   Received from [facility]   HITS    Over the last 12 months how often did your partner physically hurt you?: Never    Over the last 12 months how often did your partner insult you or talk down to you?: Never    Over the last 12 months how often did your partner threaten you with physical harm?: Never    Over the last 12 months how often did your partner scream or curse at you?: Never     Outpatient Medications Prior to Visit  Medication Sig Dispense Refill   amLODipine  (NORVASC ) 10 MG tablet Take 1 tablet (10 mg total) by mouth daily. 90 tablet 1   hydrOXYzine  (ATARAX ) 10 MG tablet Take 1 tablet (10 mg total) by mouth at bedtime as needed (insomnia). 90 tablet 1   meloxicam  (MOBIC ) 7.5 MG tablet Take 1 tablet (7.5 mg total) by mouth daily. 14 tablet 0   metoprolol  succinate (TOPROL -XL) 50 MG 24 hr tablet TAKE ONE TABLET BY MOUTH ONE TIME DAILY *TAKE WITH OR IMMEDIATELY FOLLOWING A MEAL* 90 tablet 1   Multiple Vitamin (MULTIVITAMIN) tablet Take 1 tablet by mouth daily.     OVER THE COUNTER MEDICATION BLACKSEED OIL.  Take 2 teaspoons a day with honey.     ALPRAZolam  (XANAX ) 0.5 MG tablet Take 1 tablet by mouth twice daily as needed 30 tablet 0   buPROPion  (WELLBUTRIN  XL) 150 MG 24 hr tablet Take 1 tablet (150 mg total) by mouth daily. 90 tablet 1   ondansetron  (ZOFRAN ) 4 MG tablet Take 1 tablet (4 mg total) by mouth every 8 (eight) hours as needed for nausea or vomiting. 20 tablet 0   No facility-administered medications prior to visit.    Allergies  Allergen Reactions   Cymbalta  [Duloxetine  Hcl]    Lisinopril      cough    ROS    See HPI Objective:    Physical Exam Constitutional:      General: She is not in acute distress.    Appearance: Normal appearance. She is well-developed.  HENT:     Head: Normocephalic and atraumatic.     Right Ear: External ear normal.     Left Ear: External ear normal.  Eyes:     General: No scleral icterus. Neck:     Thyroid: No thyromegaly.  Cardiovascular:     Rate and Rhythm: Normal rate and regular rhythm.     Heart sounds: Normal heart sounds. No murmur heard. Pulmonary:     Effort: Pulmonary effort is normal. No respiratory distress.     Breath sounds: Normal breath sounds. No wheezing.  Musculoskeletal:     Cervical back: Neck supple.  Skin:    General: Skin is warm and dry.  Neurological:     Mental Status: She  is alert and oriented to person, place, and time.  Psychiatric:        Mood and Affect: Mood normal.        Behavior: Behavior normal.        Thought Content: Thought content normal.        Judgment: Judgment normal.      BP Aaron Aas)  158/85   Pulse 95   Resp 16   Ht 5\' 2"  (1.575 m)   Wt 175 lb (79.4 kg)   SpO2 99%   BMI 32.01 kg/m  Wt Readings from Last 3 Encounters:  06/27/23 175 lb (79.4 kg)  04/26/23 172 lb (78 kg)  04/13/22 166 lb (75.3 kg)       Assessment & Plan:   Problem List Items Addressed This Visit       Unprioritized   Essential hypertension    Hypertension uncontrolled on amlodipine  and Toprol  XL. Night shift work may affect medication timing. Emphasized maintaining BP <140/90 to prevent complications. Discussed lifestyle modifications. - Add hydrochlorothiazide . - Continue amlodipine  and Toprol  XL. - Recheck BP and potassium in one week. - Encourage weight loss through diet and exercise. - Advise replacing sugary drinks with water. - Monitor BP daily at home.      Relevant Medications   hydrochlorothiazide  (HYDRODIURIL ) 25 MG tablet   potassium chloride  (KLOR-CON  M) 10 MEQ tablet   Other Relevant Orders   Basic Metabolic Panel (BMET)   Depression - Primary    Depression partially controlled on Wellbutrin . Mood variability persists. Increasing Wellbutrin  may stabilize mood and improve focus. Discussed potential side effects and benefits. - Increase Wellbutrin  to 300 mg daily. Use current 150 mg tablets until depletion, then switch to 300 mg tablets. - Send Wellbutrin  prescription to [facility] at [facility]. - Follow up on mood and focus in a few weeks or at physical exam. - Refill Xanax  prescription at [facility].      Relevant Medications   buPROPion  (WELLBUTRIN  XL) 300 MG 24 hr tablet   ALPRAZolam  (XANAX ) 0.5 MG tablet   Anxiety   Continues xanax  prn. Contract and UDS up to date.       Relevant Medications   buPROPion  (WELLBUTRIN  XL) 300 MG 24 hr  tablet   ALPRAZolam  (XANAX ) 0.5 MG tablet   Other Visit Diagnoses       Screening for colon cancer       Relevant Orders   Ambulatory referral to Gastroenterology       I have discontinued Nicole Larson's ondansetron  and buPROPion . I am also having her start on buPROPion , hydrochlorothiazide , and potassium chloride . Additionally, I am having her maintain her multivitamin, OVER THE COUNTER MEDICATION, metoprolol  succinate, amLODipine , hydrOXYzine , meloxicam , and ALPRAZolam .  Meds ordered this encounter  Medications   buPROPion  (WELLBUTRIN  XL) 300 MG 24 hr tablet    Sig: Take 1 tablet (300 mg total) by mouth daily.    Dispense:  90 tablet    Refill:  0   hydrochlorothiazide  (HYDRODIURIL ) 25 MG tablet    Sig: Take 1 tablet (25 mg total) by mouth daily.    Dispense:  90 tablet    Refill:  0   potassium chloride  (KLOR-CON  M) 10 MEQ tablet    Sig: Take 1 tablet (10 mEq total) by mouth daily.    Dispense:  90 tablet    Refill:  0   ALPRAZolam  (XANAX ) 0.5 MG tablet    Sig: Take 1 tablet by mouth twice daily as needed    Dispense:  30 tablet    Refill:  0

## 2023-06-27 NOTE — Assessment & Plan Note (Signed)
  Depression partially controlled on Wellbutrin . Mood variability persists. Increasing Wellbutrin  may stabilize mood and improve focus. Discussed potential side effects and benefits. - Increase Wellbutrin  to 300 mg daily. Use current 150 mg tablets until depletion, then switch to 300 mg tablets. - Send Wellbutrin  prescription to [facility] at [facility]. - Follow up on mood and focus in a few weeks or at physical exam. - Refill Xanax  prescription at [facility].

## 2023-06-27 NOTE — Patient Instructions (Signed)
 VISIT SUMMARY:  Today, we discussed your blood pressure management and mood stabilization. You mentioned concerns about elevated blood pressure readings and weight gain, as well as mood fluctuations. We reviewed your current medications and made some adjustments to better manage your conditions.  YOUR PLAN:  -HYPERTENSION: Hypertension means high blood pressure, which can lead to serious health problems if not controlled. We will add a morning diuretic to your current medications (amlodipine  and Toprol  XL) to help lower your blood pressure. Please recheck your blood pressure and potassium levels in one week. It's important to work on weight loss through diet and exercise, replace sugary drinks like Pepsi with water, and monitor your blood pressure daily at home.  -DEPRESSION: Depression is a mood disorder that causes persistent feelings of sadness and loss of interest. Your mood is partially controlled with Wellbutrin , but we will increase the dose to 300 mg daily to help stabilize your mood and improve focus. Continue using your current 150 mg tablets until they are finished, then switch to the 300 mg tablets. We will follow up on your mood and focus in a few weeks or at your next physical exam. Your Xanax  prescription will be refilled at [facility].  INSTRUCTIONS:  Please recheck your blood pressure and potassium levels in one week. Monitor your blood pressure daily at home. Follow up on your mood and focus in a few weeks or at your next physical exam. Your Wellbutrin  and Xanax  prescriptions will be sent to [facility] at [facility].

## 2023-07-05 ENCOUNTER — Encounter: Payer: Self-pay | Admitting: Family

## 2023-07-05 NOTE — Telephone Encounter (Signed)
 Patient advised both rx were sent at the same time. Pharmacy opens after 3 ( at lunch) will call them later to get rx ready

## 2023-07-06 ENCOUNTER — Ambulatory Visit

## 2023-07-19 ENCOUNTER — Other Ambulatory Visit: Payer: Self-pay | Admitting: Family

## 2023-07-19 DIAGNOSIS — F419 Anxiety disorder, unspecified: Secondary | ICD-10-CM

## 2023-07-20 NOTE — Telephone Encounter (Signed)
 Requesting: Xanax  0.5 mg Contract: 04/26/2023 UDS: 04/26/2023 Last Visit: 06/27/2023 Next Visit: 07/28/2023 Last Refill: 06/27/2023  Please Advise

## 2023-07-28 ENCOUNTER — Encounter: Admitting: Family

## 2023-08-29 ENCOUNTER — Other Ambulatory Visit: Payer: Self-pay | Admitting: Family

## 2023-08-29 DIAGNOSIS — I10 Essential (primary) hypertension: Secondary | ICD-10-CM

## 2023-08-29 DIAGNOSIS — F419 Anxiety disorder, unspecified: Secondary | ICD-10-CM

## 2023-08-29 MED ORDER — ALPRAZOLAM 0.5 MG PO TABS: 0.5000 mg | ORAL_TABLET | Freq: Two times a day (BID) | ORAL | 0 refills | Status: DC | PRN

## 2023-08-29 MED ORDER — AMLODIPINE BESYLATE 10 MG PO TABS: 10.0000 mg | ORAL_TABLET | Freq: Every day | ORAL | 1 refills | Status: DC

## 2023-08-29 NOTE — Telephone Encounter (Signed)
 Requesting: alprazolam  0.5mg   Contract: 04/26/23 UDS: 04/26/23 Last Visit: 06/27/23 Next Visit: None Last Refill: 07/20/23 #30 and 0RF  Please Advise

## 2023-08-29 NOTE — Telephone Encounter (Signed)
 Copied from CRM [id number]. Topic: Clinical - Medication Refill >> Aug 29, 2023  9:47 AM Berneda F wrote: Medication:  ALPRAZolam  (XANAX ) 0.5 MG tablet  amLODipine  (NORVASC ) 10 MG tablet   Has the patient contacted their pharmacy? No (Agent: If no, request that the patient contact the pharmacy for the refill. If patient does not wish to contact the pharmacy document the reason why and proceed with request.) (Agent: If yes, when and what did the pharmacy advise?)  This is the patient's preferred pharmacy:  [id number] [place] - [place], [place] - [address] [place] [place]. [place] [address] Phone: [phone] Fax: [phone]   Pt states she is trying to work through [employer] is billing her as a specialist, not a primary PCP and she is trying to work with them to get it corrected. States she will schedule an appt as soon as it is resolved. Spoke to our billing dept (whom she states was very rude) and is still trying to resolve the copay discrepancy.   Is this the correct pharmacy for this prescription? Yes If no, delete pharmacy and type the correct one.   Has the prescription been filled recently? Yes  Is the patient out of the medication? No  Has the patient been seen for an appointment in the last year OR does the patient have an upcoming appointment? Yes  Can we respond through MyChart? Yes  Agent: Please be advised that Rx refills may take up to 3 business days. We ask that you follow-up with your pharmacy.

## 2023-08-30 ENCOUNTER — Encounter: Admitting: Family

## 2023-11-29 ENCOUNTER — Telehealth: Admitting: Physician Assistant

## 2023-11-29 ENCOUNTER — Telehealth

## 2023-11-29 DIAGNOSIS — M545 Low back pain, unspecified: Secondary | ICD-10-CM

## 2023-11-29 MED ORDER — MELOXICAM 15 MG PO TABS: 15.0000 mg | ORAL_TABLET | Freq: Every day | ORAL | 0 refills | Status: DC

## 2023-11-29 MED ORDER — CYCLOBENZAPRINE HCL 10 MG PO TABS: 10.0000 mg | ORAL_TABLET | Freq: Three times a day (TID) | ORAL | 0 refills | Status: DC | PRN

## 2023-11-29 NOTE — Patient Instructions (Signed)
 Nicole Larson, thank you for joining Nicole Velma Lunger, PA-C for today's virtual visit.  While this provider is not your primary care provider (PCP), if your PCP is located in our provider database this encounter information will be shared with them immediately following your visit.   A [facility] MyChart account gives you access to today's visit and all your visits, tests, and labs performed at [facility]  click here if you don't have a [facility] MyChart account or go to [email or web address]  Consent: (Patient) Nicole Larson provided verbal consent for this virtual visit at the beginning of the encounter.  Current Medications:  Current Outpatient Medications:    ALPRAZolam  (XANAX ) 0.5 MG tablet, Take 1 tablet (0.5 mg total) by mouth 2 (two) times daily as needed., Disp: 30 tablet, Rfl: 0   amLODipine  (NORVASC ) 10 MG tablet, Take 1 tablet (10 mg total) by mouth daily., Disp: 90 tablet, Rfl: 1   buPROPion  (WELLBUTRIN  XL) 300 MG 24 hr tablet, Take 1 tablet (300 mg total) by mouth daily., Disp: 90 tablet, Rfl: 0   hydrochlorothiazide  (HYDRODIURIL ) 25 MG tablet, TAKE ONE TABLET BY MOUTH ONE TIME DAILY, Disp: 90 tablet, Rfl: 0   hydrOXYzine  (ATARAX ) 10 MG tablet, Take 1 tablet (10 mg total) by mouth at bedtime as needed (insomnia)., Disp: 90 tablet, Rfl: 1   meloxicam  (MOBIC ) 7.5 MG tablet, Take 1 tablet (7.5 mg total) by mouth daily., Disp: 14 tablet, Rfl: 0   metoprolol  succinate (TOPROL -XL) 50 MG 24 hr tablet, TAKE ONE TABLET BY MOUTH ONE TIME DAILY *TAKE WITH OR IMMEDIATELY FOLLOWING A MEAL*, Disp: 90 tablet, Rfl: 1   Multiple Vitamin (MULTIVITAMIN) tablet, Take 1 tablet by mouth daily., Disp: , Rfl:    OVER THE COUNTER MEDICATION, BLACKSEED OIL.  Take 2 teaspoons a day with honey., Disp: , Rfl:    potassium chloride  (KLOR-CON  M) 10 MEQ tablet, Take 1 tablet (10 mEq total) by mouth daily., Disp: 90 tablet, Rfl: 0   Medications ordered in this encounter:  No orders  of the defined types were placed in this encounter.    *If you need refills on other medications prior to your next appointment, please contact your pharmacy*  Follow-Up: Call back or seek an in-person evaluation if the symptoms worsen or if the condition fails to improve as anticipated.  [facility] [phone]  Other Instructions Please avoid heavy lifting, pushing, pulling or overexertion. Okay to continue use of a heating pad for 10 to 15 minutes at a time. Okay to use OTC Tylenol . Start the meloxicam  once daily with food. The Flexeril  can be used as directed for muscle spasm.  This can make you sleepy so as discussed, do not drive or operate heavy machinery while taking this medication. As symptoms are resolving, please start the stretches listed below. If you note any non-resolving, new, or worsening symptoms despite treatment, please seek an in-person evaluation ASAP.  Low Back Sprain or Strain Rehab Ask your health care provider which exercises are safe for you. Do exercises exactly as told by your health care provider and adjust them as directed. It is normal to feel mild stretching, pulling, tightness, or discomfort as you do these exercises. Stop right away if you feel sudden pain or your pain gets worse. Do not begin these exercises until told by your health care provider. Stretching and range-of-motion exercises These exercises warm up your muscles and joints and improve the movement and flexibility of your back. These exercises also  help to relieve pain, numbness, and tingling. Lumbar rotation  Lie on your back on a firm bed or the floor with your knees bent. Straighten your arms out to your sides so each arm forms a 90-degree angle (right angle) with a side of your body. Slowly move (rotate) both of your knees to one side of your body until you feel a stretch in your lower back (lumbar). Try not to let your shoulders lift off the floor. Hold this position  for __________ seconds. Tense your abdominal muscles and slowly move your knees back to the starting position. Repeat this exercise on the other side of your body. Repeat __________ times. Complete this exercise __________ times a day. Single knee to chest  Lie on your back on a firm bed or the floor with both legs straight. Bend one of your knees. Use your hands to move your knee up toward your chest until you feel a gentle stretch in your lower back and buttock. Hold your leg in this position by holding on to the front of your knee. Keep your other leg as straight as possible. Hold this position for __________ seconds. Slowly return to the starting position. Repeat with your other leg. Repeat __________ times. Complete this exercise __________ times a day. Prone extension on elbows  Lie on your abdomen on a firm bed or the floor (prone position). Prop yourself up on your elbows. Use your arms to help lift your chest up until you feel a gentle stretch in your abdomen and your lower back. This will place some of your body weight on your elbows. If this is uncomfortable, try stacking pillows under your chest. Your hips should stay down, against the surface that you are lying on. Keep your hip and back muscles relaxed. Hold this position for __________ seconds. Slowly relax your upper body and return to the starting position. Repeat __________ times. Complete this exercise __________ times a day. Strengthening exercises These exercises build strength and endurance in your back. Endurance is the ability to use your muscles for a long time, even after they get tired. Pelvic tilt This exercise strengthens the muscles that lie deep in the abdomen. Lie on your back on a firm bed or the floor with your legs extended. Bend your knees so they are pointing toward the ceiling and your feet are flat on the floor. Tighten your lower abdominal muscles to press your lower back against the floor. This  motion will tilt your pelvis so your tailbone points up toward the ceiling instead of pointing to your feet or the floor. To help with this exercise, you may place a small towel under your lower back and try to push your back into the towel. Hold this position for __________ seconds. Let your muscles relax completely before you repeat this exercise. Repeat __________ times. Complete this exercise __________ times a day. Alternating arm and leg raises  Get on your hands and knees on a firm surface. If you are on a hard floor, you may want to use padding, such as an exercise mat, to cushion your knees. Line up your arms and legs. Your hands should be directly below your shoulders, and your knees should be directly below your hips. Lift your left leg behind you. At the same time, raise your right arm and straighten it in front of you. Do not lift your leg higher than your hip. Do not lift your arm higher than your shoulder. Keep your abdominal and back muscles  tight. Keep your hips facing the ground. Do not arch your back. Keep your balance carefully, and do not hold your breath. Hold this position for __________ seconds. Slowly return to the starting position. Repeat with your right leg and your left arm. Repeat __________ times. Complete this exercise __________ times a day. Abdominal set with straight leg raise  Lie on your back on a firm bed or the floor. Bend one of your knees and keep your other leg straight. Tense your abdominal muscles and lift your straight leg up, 4-6 inches (10-15 cm) off the ground. Keep your abdominal muscles tight and hold this position for __________ seconds. Do not hold your breath. Do not arch your back. Keep it flat against the ground. Keep your abdominal muscles tense as you slowly lower your leg back to the starting position. Repeat with your other leg. Repeat __________ times. Complete this exercise __________ times a day. Single leg lower with bent  knees Lie on your back on a firm bed or the floor. Tense your abdominal muscles and lift your feet off the floor, one foot at a time, so your knees and hips are bent in 90-degree angles (right angles). Your knees should be over your hips and your lower legs should be parallel to the floor. Keeping your abdominal muscles tense and your knee bent, slowly lower one of your legs so your toe touches the ground. Lift your leg back up to return to the starting position. Do not hold your breath. Do not let your back arch. Keep your back flat against the ground. Repeat with your other leg. Repeat __________ times. Complete this exercise __________ times a day. Posture and body mechanics Good posture and healthy body mechanics can help to relieve stress in your body's tissues and joints. Body mechanics refers to the movements and positions of your body while you do your daily activities. Posture is part of body mechanics. Good posture means: Your spine is in its natural S-curve position (neutral). Your shoulders are pulled back slightly. Your head is not tipped forward (neutral). Follow these guidelines to improve your posture and body mechanics in your everyday activities. Standing  When standing, keep your spine neutral and your feet about hip-width apart. Keep a slight bend in your knees. Your ears, shoulders, and hips should line up. When you do a task in which you stand in one place for a long time, place one foot up on a stable object that is 2-4 inches (5-10 cm) high, such as a footstool. This helps keep your spine neutral. Sitting  When sitting, keep your spine neutral and keep your feet flat on the floor. Use a footrest, if necessary, and keep your thighs parallel to the floor. Avoid rounding your shoulders, and avoid tilting your head forward. When working at a desk or a computer, keep your desk at a height where your hands are slightly lower than your elbows. Slide your chair under your desk  so you are close enough to maintain good posture. When working at a computer, place your monitor at a height where you are looking straight ahead and you do not have to tilt your head forward or downward to look at the screen. Resting When lying down and resting, avoid positions that are most painful for you. If you have pain with activities such as sitting, bending, stooping, or squatting, lie in a position in which your body does not bend very much. For example, avoid curling up on your side with  your arms and knees near your chest (fetal position). If you have pain with activities such as standing for a long time or reaching with your arms, lie with your spine in a neutral position and bend your knees slightly. Try the following positions: Lying on your side with a pillow between your knees. Lying on your back with a pillow under your knees. Lifting  When lifting objects, keep your feet at least shoulder-width apart and tighten your abdominal muscles. Bend your knees and hips and keep your spine neutral. It is important to lift using the strength of your legs, not your back. Do not lock your knees straight out. Always ask for help to lift heavy or awkward objects. This information is not intended to replace advice given to you by your health care provider. Make sure you discuss any questions you have with your health care provider. Document Revised: 06/20/2022 Document Reviewed: 05/04/2020 Elsevier Patient Education  2024 Elsevier Inc.   If you have been instructed to have an in-person evaluation today at a local Urgent Care facility, please use the link below. It will take you to a list of all of our available [facility], including address, phone number and hours of operation. Please do not delay care.  [facility]  If you or a family member do not have a primary care provider, use the link below to schedule a visit and establish care. When you choose a [facility] primary care physician or advanced practice provider, you gain a long-term partner in health. Find a Primary Care Provider  Learn more about [facility]'s in-office and virtual care options: [facility] - Get Care Now

## 2023-11-29 NOTE — Progress Notes (Signed)
 Virtual Visit Consent   [name], you are scheduled for a virtual visit with a [facility] provider today. Just as with appointments in the office, your consent must be obtained to participate. Your consent will be active for this visit and any virtual visit you may have with one of our providers in the next 365 days. If you have a MyChart account, a copy of this consent can be sent to you electronically.  As this is a virtual visit, video technology does not allow for your provider to perform a traditional examination. This may limit your provider's ability to fully assess your condition. If your provider identifies any concerns that need to be evaluated in person or the need to arrange testing (such as labs, EKG, etc.), we will make arrangements to do so. Although advances in technology are sophisticated, we cannot ensure that it will always work on either your end or our end. If the connection with a video visit is poor, the visit may have to be switched to a telephone visit. With either a video or telephone visit, we are not always able to ensure that we have a secure connection.  By engaging in this virtual visit, you consent to the provision of healthcare and authorize for your insurance to be billed (if applicable) for the services provided during this visit. Depending on your insurance coverage, you may receive a charge related to this service.  I need to obtain your verbal consent now. Are you willing to proceed with your visit today? Nicole Larson has provided verbal consent on 11/29/2023 for a virtual visit (video or telephone). Nicole Larson, [name]  Date: 11/29/2023 7:38 PM   Virtual Visit via Video Note   I, Nicole Larson, connected with  Nicole Larson  ([id number], [id number]) on 11/29/23 at  7:30 PM EDT by a video-enabled telemedicine application and verified that I am speaking with the correct person using two identifiers.  Location: Patient: Virtual Visit  Location Patient: Home Provider: Virtual Visit Location Provider: Home Office   I discussed the limitations of evaluation and management by telemedicine and the availability of in person appointments. The patient expressed understanding and agreed to proceed.    History of Present Illness: Nicole Larson is a 48 y.o. who identifies as a female who was assigned female at birth, and is being seen today for 1 to 2 days of low back pain, initially unilateral but now bilateral.  Denies radiation of pain into her buttock or lower extremities, nor around to her groin.  Denies any associated numbness or weakness.  Has taken some over-the-counter ibuprofen  with mild relief.  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  As such, and due to the pain, she had to call out of work for her evening shift.   HPI: HPI  Problems:  Patient Active Problem List   Diagnosis Date Noted   Pain of right thumb 04/26/2023   Depression 04/05/2023   Insomnia 04/05/2023   Concussion with no loss of consciousness 04/13/2022   ADHD 06/28/2021   Strain of thoracic region 11/24/2020   Thyromegaly 10/02/2020   Uncomplicated asthma 12/11/2019   Anxiety 05/08/2019   Overweight (BMI 25.0-29.9) 08/25/2016   Essential hypertension 08/24/2016    Allergies:  Allergies  Allergen Reactions   Cymbalta  [Duloxetine  Hcl]    Lisinopril      cough   Medications:  Current Outpatient Medications:    cyclobenzaprine  (FLEXERIL ) 10 MG tablet, Take 1 tablet (10 mg  total) by mouth 3 (three) times daily as needed., Disp: 15 tablet, Rfl: 0   meloxicam  (MOBIC ) 15 MG tablet, Take 1 tablet (15 mg total) by mouth daily., Disp: 15 tablet, Rfl: 0   ALPRAZolam  (XANAX ) 0.5 MG tablet, Take 1 tablet (0.5 mg total) by mouth 2 (two) times daily as needed., Disp: 30 tablet, Rfl: 0   amLODipine  (NORVASC ) 10 MG tablet, Take 1 tablet (10 mg total) by mouth daily., Disp: 90 tablet, Rfl: 1   buPROPion  (WELLBUTRIN  XL) 300 MG 24 hr  tablet, Take 1 tablet (300 mg total) by mouth daily., Disp: 90 tablet, Rfl: 0   hydrochlorothiazide  (HYDRODIURIL ) 25 MG tablet, TAKE ONE TABLET BY MOUTH ONE TIME DAILY, Disp: 90 tablet, Rfl: 0   hydrOXYzine  (ATARAX ) 10 MG tablet, Take 1 tablet (10 mg total) by mouth at bedtime as needed (insomnia)., Disp: 90 tablet, Rfl: 1   metoprolol  succinate (TOPROL -XL) 50 MG 24 hr tablet, TAKE ONE TABLET BY MOUTH ONE TIME DAILY *TAKE WITH OR IMMEDIATELY FOLLOWING A MEAL*, Disp: 90 tablet, Rfl: 1   Multiple Vitamin (MULTIVITAMIN) tablet, Take 1 tablet by mouth daily., Disp: , Rfl:    OVER THE COUNTER MEDICATION, BLACKSEED OIL.  Take 2 teaspoons a day with honey., Disp: , Rfl:    potassium chloride  (KLOR-CON  M) 10 MEQ tablet, Take 1 tablet (10 mEq total) by mouth daily., Disp: 90 tablet, Rfl: 0  Observations/Objective: Patient is well-developed, well-nourished in no acute distress.  Resting comfortably at home.  Head is normocephalic, atraumatic.  No labored breathing. Speech is clear and coherent with logical content.  Patient is alert and oriented at baseline.   Assessment and Plan: 1. Acute bilateral low back pain without sciatica (Primary) - cyclobenzaprine  (FLEXERIL ) 10 MG tablet; Take 1 tablet (10 mg total) by mouth 3 (three) times daily as needed.  Dispense: 15 tablet; Refill: 0 - meloxicam  (MOBIC ) 15 MG tablet; Take 1 tablet (15 mg total) by mouth daily.  Dispense: 15 tablet; Refill: 0  Atraumatic.  No alarm signs or symptoms present.  Supportive measures and OTC medications reviewed.  Low back stretches reviewed and handout given in AVS.  Okay to use OTC Tylenol  and heating pad.  Will start meloxicam  15 mg once daily with food.  Flexeril  per orders.  No driving or operating heavy machinery while taking the muscle relaxant.  She is to seek an in person evaluation ASAP for any nonresolving, new or worsening symptoms despite treatment.  Work note provided.  Follow Up Instructions: I discussed the  assessment and treatment plan with the patient. The patient was provided an opportunity to ask questions and all were answered. The patient agreed with the plan and demonstrated an understanding of the instructions.  A copy of instructions were sent to the patient via MyChart unless otherwise noted below.   The patient was advised to call back or seek an in-person evaluation if the symptoms worsen or if the condition fails to improve as anticipated.    Nicole Velma Lunger, PA-C

## 2023-12-23 ENCOUNTER — Emergency Department (HOSPITAL_BASED_OUTPATIENT_CLINIC_OR_DEPARTMENT_OTHER)
Admission: EM | Admit: 2023-12-23 | Discharge: 2023-12-23 | Disposition: A | Discharge status: Home / Self Care | Payer: Self-pay

## 2023-12-23 ENCOUNTER — Emergency Department (HOSPITAL_BASED_OUTPATIENT_CLINIC_OR_DEPARTMENT_OTHER): Payer: Self-pay

## 2023-12-23 ENCOUNTER — Encounter (HOSPITAL_BASED_OUTPATIENT_CLINIC_OR_DEPARTMENT_OTHER): Payer: Self-pay

## 2023-12-23 DIAGNOSIS — M25512 Pain in left shoulder: Secondary | ICD-10-CM | POA: Insufficient documentation

## 2023-12-23 DIAGNOSIS — Z79899 Other long term (current) drug therapy: Secondary | ICD-10-CM | POA: Insufficient documentation

## 2023-12-23 DIAGNOSIS — J45909 Unspecified asthma, uncomplicated: Secondary | ICD-10-CM | POA: Insufficient documentation

## 2023-12-23 DIAGNOSIS — X500XXA Overexertion from strenuous movement or load, initial encounter: Secondary | ICD-10-CM | POA: Insufficient documentation

## 2023-12-23 DIAGNOSIS — M545 Low back pain, unspecified: Secondary | ICD-10-CM

## 2023-12-23 DIAGNOSIS — I10 Essential (primary) hypertension: Secondary | ICD-10-CM | POA: Insufficient documentation

## 2023-12-23 DIAGNOSIS — Y990 Civilian activity done for income or pay: Secondary | ICD-10-CM | POA: Insufficient documentation

## 2023-12-23 LAB — BASIC METABOLIC PANEL WITH GFR
Anion gap: 11 (ref 5–15)
BUN: 16 mg/dL (ref 6–20)
CO2: 27 mmol/L (ref 22–32)
Calcium: 8.9 mg/dL (ref 8.9–10.3)
Chloride: 104 mmol/L (ref 98–111)
Creatinine, Ser: 1.14 mg/dL — ABNORMAL HIGH (ref 0.44–1.00)
GFR, Estimated: >60 mL/min (ref >60)
Glucose, Bld: 89 mg/dL (ref 70–99)
Potassium: 3.2 mmol/L — ABNORMAL LOW (ref 3.5–5.1)
Sodium: 141 mmol/L (ref 135–145)

## 2023-12-23 LAB — CBC
HCT: 36.1 % (ref 36.0–46.0)
Hemoglobin: 12.9 g/dL (ref 12.0–15.0)
MCH: 27.5 pg (ref 26.0–34.0)
MCHC: 35.7 g/dL (ref 30.0–36.0)
MCV: 77 fL — ABNORMAL LOW (ref 80.0–100.0)
Platelets: 289 K/uL (ref 150–400)
RBC: 4.69 MIL/uL (ref 3.87–5.11)
RDW: 13.3 % (ref 11.5–15.5)
WBC: 5.7 K/uL (ref 4.0–10.5)
nRBC: 0 % (ref 0.0–0.2)

## 2023-12-23 LAB — TROPONIN T, HIGH SENSITIVITY: Troponin T High Sensitivity: <15 ng/L (ref 0–19)

## 2023-12-23 MED ORDER — KETOROLAC TROMETHAMINE 15 MG/ML IJ SOLN
15.0000 mg | Freq: Once | INTRAMUSCULAR | Status: AC
  Administered 2023-12-23: 15 mg via INTRAVENOUS

## 2023-12-23 MED ORDER — POTASSIUM CHLORIDE CRYS ER 20 MEQ PO TBCR
40.0000 meq | EXTENDED_RELEASE_TABLET | Freq: Once | ORAL | Status: AC
Start: 2023-12-23 — End: 2023-12-23
  Administered 2023-12-23: 40 meq via ORAL
  Filled 2023-12-23: qty 2

## 2023-12-23 MED ORDER — KETOROLAC TROMETHAMINE 15 MG/ML IJ SOLN
15.0000 mg | Freq: Once | INTRAMUSCULAR | Status: DC
  Filled 2023-12-23: qty 1

## 2023-12-23 MED ORDER — LACTATED RINGERS IV BOLUS
1000.0000 mL | Freq: Once | INTRAVENOUS | Status: AC
  Administered 2023-12-23: 1000 mL via INTRAVENOUS

## 2023-12-23 MED ORDER — LIDOCAINE 5 % EX PTCH
1.0000 | MEDICATED_PATCH | CUTANEOUS | 0 refills | Status: DC
Start: 2023-12-23 — End: 2024-01-22

## 2023-12-23 MED ORDER — MELOXICAM 15 MG PO TABS: 15.0000 mg | ORAL_TABLET | Freq: Every day | ORAL | 0 refills | Status: DC

## 2023-12-23 NOTE — ED Provider Notes (Signed)
 [place] [facility] AT [facility] Provider Note   CSN: [id number] Arrival date & time: 12/23/23  1145     Patient presents with: Shoulder Pain   Nicole Larson is a 48 y.o. female.   48 year old female with past medical history of hypertension and asthma presenting to the emergency department today with pain in her left shoulder.  This been going now for the past 24 to 48 hours.  The patient states that she does a lot of heavy lifting at work.  States that the pain started yesterday.  She denies any associated fevers.  Denies any obvious injuries.  She came to the ER today for further evaluation regarding this due to ongoing symptoms.  She denies any chest pain with this.  Denies any shortness of breath.   Shoulder Pain      Prior to Admission medications   Medication Sig Start Date End Date Taking? Authorizing Provider  lidocaine (LIDODERM) 5 % Place 1 patch onto the skin daily. Remove & Discard patch within 12 hours or as directed by MD 12/23/23  Yes Ula Prentice SAUNDERS, MD  ALPRAZolam  (XANAX ) 0.5 MG tablet Take 1 tablet (0.5 mg total) by mouth 2 (two) times daily as needed. 08/29/23   O'Sullivan, Melissa, NP  amLODipine  (NORVASC ) 10 MG tablet Take 1 tablet (10 mg total) by mouth daily. 08/29/23   O'Sullivan, Melissa, NP  buPROPion  (WELLBUTRIN  XL) 300 MG 24 hr tablet Take 1 tablet (300 mg total) by mouth daily. 06/27/23   O'Sullivan, Melissa, NP  cyclobenzaprine  (FLEXERIL ) 10 MG tablet Take 1 tablet (10 mg total) by mouth 3 (three) times daily as needed. 11/29/23   Gladis Elsie BROCKS, PA-C  hydrochlorothiazide  (HYDRODIURIL ) 25 MG tablet TAKE ONE TABLET BY MOUTH ONE TIME DAILY 07/20/23   O'Sullivan, Melissa, NP  hydrOXYzine  (ATARAX ) 10 MG tablet Take 1 tablet (10 mg total) by mouth at bedtime as needed (insomnia). 04/26/23   O'Sullivan, Melissa, NP  meloxicam  (MOBIC ) 15 MG tablet Take 1 tablet (15 mg total) by mouth daily. 12/23/23   Ula Prentice SAUNDERS, MD  metoprolol  succinate  (TOPROL -XL) 50 MG 24 hr tablet TAKE ONE TABLET BY MOUTH ONE TIME DAILY *TAKE WITH OR IMMEDIATELY FOLLOWING A MEAL* 04/26/23   O'Sullivan, Melissa, NP  Multiple Vitamin (MULTIVITAMIN) tablet Take 1 tablet by mouth daily.    [provider]  OVER THE COUNTER MEDICATION BLACKSEED OIL.  Take 2 teaspoons a day with honey.    [provider]  potassium chloride  (KLOR-CON  M) 10 MEQ tablet Take 1 tablet (10 mEq total) by mouth daily. 06/27/23   Daryl Setter, NP    Allergies: Cymbalta  [duloxetine  hcl] and Lisinopril     Review of Systems  Musculoskeletal:  Positive for arthralgias.  All other systems reviewed and are negative.   Updated Vital Signs BP (!) 153/95 (BP Location: Right Arm)   Pulse (!) 108   Temp 98.6 F (37 C)   Resp 18   Ht 5' 2 (1.575 m)   Wt 77.1 kg   LMP 12/06/2023 (Approximate)   SpO2 97%   BMI 31.09 kg/m   Physical Exam Vitals and nursing note reviewed.   Gen: NAD Eyes: PERRL, EOMI HEENT: no oropharyngeal swelling Neck: trachea midline Resp: clear to auscultation bilaterally Card: RRR, no murmurs, rubs, or gallops Abd: nontender, nondistended Extremities: no calf tenderness, no edema MSK: The patient is tender over the proximal humerus on the left with no obvious overlying erythema or warmth, the patient has normal active  and passive range of motion there is some crepitus noted with passive range of motion, positive empty can test Neuro: 5 out of 5 strength throughout bilateral upper extremities with normal sensation throughout Vascular: 2+ radial pulses bilaterally, 2+ DP pulses bilaterally Skin: no rashes Psyc: acting appropriately   (all labs ordered are listed, but only abnormal results are displayed) Labs Reviewed  BASIC METABOLIC PANEL WITH GFR - Abnormal; Notable for the following components:      Result Value   Potassium 3.2 (*)    Creatinine, Ser 1.14 (*)    All other components within normal limits  CBC - Abnormal; Notable  for the following components:   MCV 77.0 (*)    All other components within normal limits  TROPONIN T, HIGH SENSITIVITY  TROPONIN T, HIGH SENSITIVITY    EKG: EKG Interpretation Date/Time:  Saturday December 23 2023 12:31:53 EDT Ventricular Rate:  103 PR Interval:  147 QRS Duration:  80 QT Interval:  358 QTC Calculation: 469 R Axis:   64  Text Interpretation: Sinus tachycardia Nonspecific ST-T changes Early repolarization pattern No STEMI Confirmed by Ula Barter [phone]) on 12/23/2023 12:35:16 PM  Radiology: [facility] Shoulder Left Result Date: 12/23/2023 CLINICAL DATA:  Left shoulder pain, no known injury. EXAM: LEFT SHOULDER - 2+ VIEW COMPARISON:  Shoulder radiograph 12/19/2016 FINDINGS: There is no evidence of fracture or dislocation. The alignment and joint spaces are preserved. There is no evidence of arthropathy or other focal bone abnormality. Soft tissues are unremarkable. IMPRESSION: Negative radiographs of the left shoulder. Electronically Signed   By: Andrea Gasman M.D.   On: 12/23/2023 12:56     Procedures   Medications Ordered in the ED  ketorolac  (TORADOL ) 15 MG/ML injection 15 mg (15 mg Intravenous Given 12/23/23 1250)  lactated ringers bolus 1,000 mL (1,000 mLs Intravenous New Bag/Given 12/23/23 1308)  potassium chloride  SA (KLOR-CON  M) CR tablet 40 mEq (40 mEq Oral Given 12/23/23 1400)                                    Medical Decision Making 48 year old female with past medical history of hypertension and asthma presenting to the emergency department today with left shoulder pain.  Will further evaluate the patient with an EKG to screen for cardiac etiology although this does seem to be reproducible.  Will obtain x-ray to eval for acute bony abnormalities.  Suspect this is likely due to arthritis given her recurrent heavy lifting at work.  I will give the patient IM Toradol  here.  Suspicion for ACS is relatively low.  She does not have any history of fever or risk  factors for septic arthritis and exam was not consistent with this so suspicion for this is low as well.  I will reevaluate for ultimate disposition.  The patient's EKG interpreted by me shows a sinus rhythm with early repolarization and nonspecific ST-T changes.  Patient does have some T wave flattening and T wave inversion lead III which does appear new.  Will obtain a troponin to evaluate for ACS although suspicion for this is still relatively low.  EKG is not consistent with STEMI.  The patient's troponin is negative.  X-ray does not show any acute findings.  The patient is feeling better on reassessment.  Her heart rate did improve to the high 90s and will occasionally go into the low 100s here but she is otherwise well-appearing so I think  that she is stable for discharge.  Amount and/or Complexity of Data Reviewed Labs: ordered. Radiology: ordered.  Risk Prescription drug management.        Final diagnoses:  Acute pain of left shoulder    ED Discharge Orders          Ordered    lidocaine (LIDODERM) 5 %  Every 24 hours        12/23/23 1416    meloxicam  (MOBIC ) 15 MG tablet  Daily        12/23/23 1416               Ula Prentice SAUNDERS, MD 12/23/23 1416

## 2023-12-23 NOTE — ED Notes (Signed)
 ED Provider at bedside.

## 2023-12-23 NOTE — ED Triage Notes (Signed)
 Pt's left shoulder is hurting. Has been hurting since yesterday, no known injury. Pt is a CNA. Pain does not radiate down arm, across chest, or up jaw.

## 2023-12-23 NOTE — ED Notes (Signed)
 Pt stated that she is unable to void at this time.  Pt asked to provide urine sample as soon as she is able.

## 2023-12-23 NOTE — Discharge Instructions (Signed)
 Your workup today was reassuring.  Please take the anti-inflammatory as prescribed and use the lidocaine patches as needed.  Follow-up with the orthopedic doctor at the number provided.  Return to the ER for worsening symptoms.

## 2024-01-22 ENCOUNTER — Other Ambulatory Visit: Payer: Self-pay

## 2024-01-22 ENCOUNTER — Emergency Department (HOSPITAL_BASED_OUTPATIENT_CLINIC_OR_DEPARTMENT_OTHER)
Admission: EM | Admit: 2024-01-22 | Discharge: 2024-01-22 | Disposition: A | Discharge status: Home / Self Care | Payer: Self-pay

## 2024-01-22 ENCOUNTER — Encounter (HOSPITAL_BASED_OUTPATIENT_CLINIC_OR_DEPARTMENT_OTHER): Payer: Self-pay

## 2024-01-22 DIAGNOSIS — M545 Low back pain, unspecified: Secondary | ICD-10-CM | POA: Insufficient documentation

## 2024-01-22 MED ORDER — METHOCARBAMOL 500 MG PO TABS
500.0000 mg | ORAL_TABLET | Freq: Once | ORAL | Status: AC
  Administered 2024-01-22: 500 mg via ORAL
  Filled 2024-01-22: qty 1

## 2024-01-22 MED ORDER — LIDOCAINE 5 % EX PTCH
1.0000 | MEDICATED_PATCH | Freq: Once | CUTANEOUS | Status: DC
  Administered 2024-01-22: 1 via TRANSDERMAL
  Filled 2024-01-22: qty 1

## 2024-01-22 MED ORDER — LIDOCAINE 5 % EX PTCH: 1.0000 | MEDICATED_PATCH | CUTANEOUS | 0 refills | Status: AC

## 2024-01-22 MED ORDER — METHOCARBAMOL 500 MG PO TABS: 500.0000 mg | ORAL_TABLET | Freq: Two times a day (BID) | ORAL | 0 refills | Status: AC | PRN

## 2024-01-22 NOTE — ED Provider Notes (Signed)
 [place] [facility] Provider Note   CSN: [id number] Arrival date & time: 01/22/24  9243     Patient presents with: Back Pain   Nicole Larson is a 48 y.o. female.   48 year old female presenting emergency department for back pain.  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  Had improved and was doing okay but symptoms returned yesterday or the day before.  No specific trauma or event that she can recall.  Denies weakness in her lower extremities, numbness tingling changes in sensation.  No saddle anesthesia.  No fevers or systemic infection.  No bowel or bladder incontinence.  No constitutional symptoms.  No history of IV drug use.  No immunosuppressive medications.   Back Pain      Prior to Admission medications   Medication Sig Start Date End Date Taking? Authorizing Provider  lidocaine  (LIDODERM ) 5 % Place 1 patch onto the skin daily. Remove & Discard patch within 12 hours or as directed by MD 01/22/24  Yes Neysa Caron PARAS, DO  methocarbamol  (ROBAXIN ) 500 MG tablet Take 1 tablet (500 mg total) by mouth 2 (two) times daily as needed for muscle spasms. 01/22/24  Yes Neysa Caron PARAS, DO  ALPRAZolam  (XANAX ) 0.5 MG tablet Take 1 tablet (0.5 mg total) by mouth 2 (two) times daily as needed. 08/29/23   O'Sullivan, Melissa, NP  amLODipine  (NORVASC ) 10 MG tablet Take 1 tablet (10 mg total) by mouth daily. 08/29/23   O'Sullivan, Melissa, NP  buPROPion  (WELLBUTRIN  XL) 300 MG 24 hr tablet Take 1 tablet (300 mg total) by mouth daily. 06/27/23   O'Sullivan, Melissa, NP  cyclobenzaprine  (FLEXERIL ) 10 MG tablet Take 1 tablet (10 mg total) by mouth 3 (three) times daily as needed. 11/29/23   Gladis Elsie BROCKS, PA-C  hydrochlorothiazide  (HYDRODIURIL ) 25 MG tablet TAKE ONE TABLET BY MOUTH ONE TIME DAILY 07/20/23   O'Sullivan, Melissa, NP  hydrOXYzine  (ATARAX ) 10 MG tablet Take 1 tablet (10 mg total) by mouth at  bedtime as needed (insomnia). 04/26/23   O'Sullivan, Melissa, NP  meloxicam  (MOBIC ) 15 MG tablet Take 1 tablet (15 mg total) by mouth daily. 12/23/23   Ula Prentice SAUNDERS, MD  metoprolol  succinate (TOPROL -XL) 50 MG 24 hr tablet TAKE ONE TABLET BY MOUTH ONE TIME DAILY *TAKE WITH OR IMMEDIATELY FOLLOWING A MEAL* 04/26/23   O'Sullivan, Melissa, NP  Multiple Vitamin (MULTIVITAMIN) tablet Take 1 tablet by mouth daily.    [provider]  OVER THE COUNTER MEDICATION BLACKSEED OIL.  Take 2 teaspoons a day with honey.    [provider]  potassium chloride  (KLOR-CON  M) 10 MEQ tablet Take 1 tablet (10 mEq total) by mouth daily. 06/27/23   Daryl Setter, NP    Allergies: Cymbalta  [duloxetine  hcl] and Lisinopril     Review of Systems  Musculoskeletal:  Positive for back pain.    Updated Vital Signs BP (!) 142/100   Pulse 81   Temp (!) 97.5 F (36.4 C) (Oral)   Resp 18   Ht 5' 2 (1.575 m)   Wt 74.8 kg   LMP 12/06/2023   SpO2 99%   BMI 30.18 kg/m   Physical Exam Vitals reviewed.  HENT:     Head: Normocephalic.     Nose: Nose normal.     Mouth/Throat:     Mouth: Mucous membranes are moist.  Eyes:     Conjunctiva/sclera: Conjunctivae normal.  Cardiovascular:  Rate and Rhythm: Normal rate.  Pulmonary:     Effort: Pulmonary effort is normal.  Musculoskeletal:     Comments: No midline spinal tenderness.  Does have some tenderness to the left mid back musculature.  5-5 plantarflexion, dorsiflexion, able lift legs off bed.  Normal sensation.  Equal pulses.  Neurological:     Mental Status: She is alert.     (all labs ordered are listed, but only abnormal results are displayed) Labs Reviewed - No data to display  EKG: None  Radiology: No results found.   Procedures   Medications Ordered in the ED  lidocaine  (LIDODERM ) 5 % 1 patch (1 patch Transdermal Patch Applied 01/22/24 0834)  methocarbamol  (ROBAXIN ) tablet 500 mg (500 mg Oral Given 01/22/24 0834)                                     Medical Decision Making Well-appearing 48 year old female present emergency department for back pain.  Vital signs reassuring.  History and physical exam without red flags.  Low suspicion for acute cord compression/cauda equina.  Exam consistent with strain.  Given lidocaine  patch and muscle relaxer.  Discussed supportive medications and follow-up with PCP.  Stable for discharge at this time  Risk Prescription drug management.      Final diagnoses:  Acute right-sided low back pain without sciatica    ED Discharge Orders          Ordered    lidocaine  (LIDODERM ) 5 %  Every 24 hours        01/22/24 0829    methocarbamol  (ROBAXIN ) 500 MG tablet  2 times daily PRN        01/22/24 9170               Neysa Caron PARAS, DO 01/22/24 1231

## 2024-01-22 NOTE — Discharge Instructions (Addendum)
 Please follow-up with your primary doctor.  As discussed take Tylenol  alternating with ibuprofen  with dosages as directed on the packaging every 4 hours for baseline pain control.  You may also use lidocaine  patches and muscle relaxers that we are prescribing you for further pain relief.  Return for fevers, chills, severe pain, weakness in your lower extremities, bowel or bladder incontinence, numbness in your genital area, or if you develop any new or worsening symptoms that are concerning to you.

## 2024-01-22 NOTE — ED Triage Notes (Addendum)
 Pt states that she has history of HTN. Has been high a few days with meds. States that she is also having back pain. Worse with movement. States that back pain is due from work injury that is progressively getting worse.

## 2024-03-12 ENCOUNTER — Ambulatory Visit: Payer: Self-pay | Admitting: Family

## 2024-03-13 ENCOUNTER — Ambulatory Visit: Payer: Self-pay | Admitting: Family

## 2024-03-13 VITALS — BP 153/97 | HR 95 | Temp 98.7°F | Resp 16 | Ht 62.0 in | Wt 168.0 lb

## 2024-03-13 DIAGNOSIS — I10 Essential (primary) hypertension: Secondary | ICD-10-CM

## 2024-03-13 DIAGNOSIS — F419 Anxiety disorder, unspecified: Secondary | ICD-10-CM | POA: Diagnosis not present

## 2024-03-13 DIAGNOSIS — S46911A Strain of unspecified muscle, fascia and tendon at shoulder and upper arm level, right arm, initial encounter: Secondary | ICD-10-CM | POA: Diagnosis not present

## 2024-03-13 DIAGNOSIS — E876 Hypokalemia: Secondary | ICD-10-CM | POA: Diagnosis not present

## 2024-03-13 DIAGNOSIS — M545 Low back pain, unspecified: Secondary | ICD-10-CM

## 2024-03-13 DIAGNOSIS — F32A Depression, unspecified: Secondary | ICD-10-CM | POA: Diagnosis not present

## 2024-03-13 DIAGNOSIS — R5383 Other fatigue: Secondary | ICD-10-CM

## 2024-03-13 DIAGNOSIS — G4700 Insomnia, unspecified: Secondary | ICD-10-CM

## 2024-03-13 LAB — BASIC METABOLIC PANEL WITH GFR
BUN: 17 mg/dL (ref 6–23)
CO2: 36 meq/L — ABNORMAL HIGH (ref 19–32)
Calcium: 9.5 mg/dL (ref 8.4–10.5)
Chloride: 101 meq/L (ref 96–112)
Creatinine, Ser: 1.16 mg/dL (ref 0.40–1.20)
GFR: 56.69 mL/min — ABNORMAL LOW
Glucose, Bld: 100 mg/dL — ABNORMAL HIGH (ref 70–99)
Potassium: 3.7 meq/L (ref 3.5–5.1)
Sodium: 143 meq/L (ref 135–145)

## 2024-03-13 LAB — VITAMIN D 25 HYDROXY (VIT D DEFICIENCY, FRACTURES): VITD: 11.57 ng/mL — ABNORMAL LOW (ref 30.00–100.00)

## 2024-03-13 LAB — VITAMIN B12: Vitamin B-12: 242 pg/mL (ref 211–911)

## 2024-03-13 MED ORDER — ALPRAZOLAM 0.5 MG PO TABS: 0.5000 mg | ORAL_TABLET | Freq: Two times a day (BID) | ORAL | 0 refills | Status: AC | PRN

## 2024-03-13 MED ORDER — AMLODIPINE BESYLATE 10 MG PO TABS: 10.0000 mg | ORAL_TABLET | Freq: Every day | ORAL | 1 refills | Status: AC

## 2024-03-13 MED ORDER — BUPROPION HCL ER (XL) 300 MG PO TB24: 300.0000 mg | ORAL_TABLET | Freq: Every day | ORAL | 1 refills | Status: AC

## 2024-03-13 MED ORDER — MELOXICAM 15 MG PO TABS: 15.0000 mg | ORAL_TABLET | Freq: Every day | ORAL | 0 refills | Status: AC

## 2024-03-13 NOTE — Assessment & Plan Note (Signed)
" °  Previous low potassium levels noted. Not on supplements. Discussed importance of supplementation due to diuretic use and risks of low potassium. - Ordered lab tests to check potassium levels. "

## 2024-03-13 NOTE — Patient Instructions (Signed)
" °  VISIT SUMMARY: During your visit, we addressed your elevated blood pressure, anxiety and depression, insomnia, right shoulder pain, low back pain, and low potassium levels. We discussed medication management and lifestyle modifications to help manage these conditions.  YOUR PLAN: -ESSENTIAL HYPERTENSION: Essential hypertension is high blood pressure without a known secondary cause. Your blood pressure was elevated at 175/105 mmHg. We discussed the importance of resuming your amlodipine  medication and making lifestyle changes. A refill for amlodipine  10 mg daily was sent. Please monitor your blood pressure at home and report if it remains elevated after resuming amlodipine . We will recheck your blood pressure in one month.  -ANXIETY AND DEPRESSION: Anxiety and depression are mental health conditions that can cause significant distress. Your anxiety is situational and exacerbated by stress, while your depression symptoms persist. We discussed the benefits of counseling. Refills for Xanax  0.5 mg twice daily as needed and Wellbutrin  XL 150 mg daily were sent. A pamphlet on counseling services was provided.  -INSOMNIA: Insomnia is difficulty falling or staying asleep. You have been using Benadryl and Xanax , which are less effective than your previous medication. A refill for Xanax  0.5 mg twice daily as needed was sent.  -RIGHT SHOULDER PAIN, POSSIBLE ROTATOR CUFF STRAIN: Right shoulder pain with radiation down the arm may indicate a rotator cuff strain. There was no recent injury. A refill for meloxicam  7.5 mg daily was sent. Avoid painful activities and follow up if the pain does not improve in a few weeks.  -ACUTE LOW BACK PAIN: Acute low back pain is sudden pain in the lower back. 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 A letter for work absence due to pain was provided.  -HYPOKALEMIA: Hypokalemia is low potassium levels in the blood. You are not currently on potassium supplements, which  is important due to your diuretic use. We discussed the importance of supplementation and ordered lab tests to check your potassium levels.  -GENERAL HEALTH MAINTENANCE: We discussed the importance of maintaining adequate vitamin D  levels. Lab tests were ordered to check your vitamin D  levels.  INSTRUCTIONS: Please follow up in one month to recheck your blood pressure. Monitor your blood pressure at home and report if it remains elevated after resuming amlodipine . Follow up if your right shoulder pain does not improve in a few weeks. Lab tests were ordered to check your potassium and vitamin D  levels.                    "

## 2024-03-13 NOTE — Progress Notes (Signed)
 "  Subjective:     Patient ID: Nicole Larson, female    DOB: 12/27/1975, 49 y.o.   MRN: [id number]  Chief Complaint  Patient presents with   Hypertension    Here for follow up    Anxiety    Here for follow up,  last CSC and UDS 04/26/23     Hypertension Associated symptoms include anxiety.  Anxiety      Discussed the use of AI scribe software for clinical note transcription with the patient, who gave verbal consent to proceed.  History of Present Illness Nicole Larson is a 49 year old female with hypertension who presents with elevated blood pressure and medication management issues.  She has elevated blood pressure, recorded at 175/105 mmHg during the visit, which was an improvement from a higher reading at [employer] 30 minutes prior. She is prescribed amlodipine  10 mg, hydrochlorothiazide  25 mg, and Toprol  XL 50 mg for hypertension. She has been out of amlodipine  for about a week. She adheres to metoprolol  and hydrochlorothiazide  but not to potassium supplementation.  She experiences anxiety and mood disturbances due to situational stressors, including work-related stress and financial pressures from her son's college expenses. 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 She is not taking any sleep medication and uses Benadryl, which she finds less effective than her previous sleep medication.  She describes a recent onset of right arm pain, starting in the shoulder and radiating down the arm, persisting for several days. No specific injury or new activities are reported. She has previously used meloxicam  for pain management and is considering its use again for relief.  She has a history of back pain following an incident at work where she was injured while assisting a resident. This injury led to her dismissal from her job, and she is currently pursuing legal action. The back pain  occasionally flares up, requiring her to stop activities.  She reports a recent stomach bug and acknowledges not taking her potassium supplement, which was previously noted to be low.  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      Health Maintenance Due  Topic Date Due   Hepatitis C Screening  Never done   Pneumococcal Vaccine (1 of 2 - PCV) Never done   Hepatitis B Vaccines 19-59 Average Risk (1 of 3 - 19+ 3-dose series) Never done   Mammogram  10/02/2022   Colonoscopy  Never done   COVID-19 Vaccine (3 - 2025-26 season) 10/30/2023    Past Medical History:  Diagnosis Date   Asthma    History of COVID-19 11/26/2019   Hypertension    Pneumonia    Upper airway cough syndrome 12/27/2019   Onset in Aug 2021 related to covid - d/c acei 12/26/2019 with max rx for gerd     Past Surgical History:  Procedure Laterality Date   TUBAL LIGATION      Family History  Problem Relation Age of Onset   Sickle cell anemia Father    Diabetes Maternal Grandmother    Hypertension Maternal Grandmother    Diabetes Maternal Grandfather    Hypertension Maternal Grandfather    Diabetes Paternal Grandmother    Diabetes Paternal Grandfather     Social History   Socioeconomic History   Marital status:  Divorced    Spouse name: Not on file   Number of children: Not on file   Years of education: Not on file   Highest education level: Not on file  Occupational History   Not on file  Tobacco Use   Smoking status: Never   Smokeless tobacco: Never  Vaping Use   Vaping status: Never Used  Substance and Sexual Activity   Alcohol use: Yes    Comment: occasionally throughout year   Drug use: No   Sexual activity: Not Currently    Birth control/protection: Surgical  Other Topics Concern   Not on file  Social History Narrative   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   4 children (1 daughter and 3 sons) Her 3 sons live at home   Has one granddaughter   Happily Divorced   Enjoys spending time with her children, shopping, netflix, going out, vacation    Social Drivers of Health   Tobacco Use: Low Risk (01/22/2024)   Patient History    Smoking Tobacco Use: Never    Smokeless Tobacco Use: Never    Passive Exposure: Not on file  Financial Resource Strain: Not on file  Food Insecurity: Not on file  Transportation Needs: Not on file  Physical Activity: Not on file  Stress: Not on file  Social Connections: Not on file  Intimate Partner Violence: Not At Risk (11/09/2022)   Received from [facility]   HITS    Over the last 12 months how often did your partner physically hurt you?: Never    Over the last 12 months how often did your partner insult you or talk down to you?: Never    Over the last 12 months how often did your partner threaten you with physical harm?: Never    Over the last 12 months how often did your partner scream or curse at you?: Never  Depression (PHQ2-9): High Risk (04/05/2023)   Depression (PHQ2-9)    PHQ-2 Score: 11  Alcohol Screen: Not on file  Housing: Not on file  Utilities: Not on file  Health Literacy: Not on file    Outpatient Medications Prior to Visit  Medication Sig Dispense Refill   hydrochlorothiazide  (HYDRODIURIL ) 25 MG tablet TAKE ONE TABLET BY MOUTH ONE TIME DAILY 90 tablet 0   hydrOXYzine  (ATARAX ) 10 MG tablet Take 1 tablet (10 mg total) by mouth at bedtime as needed (insomnia). 90 tablet 1   lidocaine  (LIDODERM ) 5 % Place 1 patch onto the skin daily. Remove & Discard patch within 12 hours or as directed by MD 14 patch 0   methocarbamol  (ROBAXIN ) 500 MG tablet Take 1 tablet (500 mg total) by mouth 2 (two) times daily as needed for muscle spasms. 20 tablet 0   metoprolol  succinate (TOPROL -XL) 50 MG 24 hr tablet TAKE ONE TABLET BY MOUTH ONE TIME DAILY *TAKE WITH OR IMMEDIATELY FOLLOWING A MEAL* 90 tablet 1    Multiple Vitamin (MULTIVITAMIN) tablet Take 1 tablet by mouth daily.     OVER THE COUNTER MEDICATION BLACKSEED OIL.  Take 2 teaspoons a day with honey.     potassium chloride  (KLOR-CON  M) 10 MEQ tablet Take 1 tablet (10 mEq total) by mouth daily. 90 tablet 0   ALPRAZolam  (XANAX ) 0.5 MG tablet Take 1 tablet (0.5 mg total) by mouth 2 (two) times daily as needed. 30 tablet 0   amLODipine  (NORVASC ) 10 MG tablet Take 1 tablet (10 mg total) by mouth daily. 90 tablet 1  buPROPion  (WELLBUTRIN  XL) 300 MG 24 hr tablet Take 1 tablet (300 mg total) by mouth daily. 90 tablet 0   cyclobenzaprine  (FLEXERIL ) 10 MG tablet Take 1 tablet (10 mg total) by mouth 3 (three) times daily as needed. 15 tablet 0   meloxicam  (MOBIC ) 15 MG tablet Take 1 tablet (15 mg total) by mouth daily. 15 tablet 0   No facility-administered medications prior to visit.    Allergies[1]  ROS See HPI    Objective:    Physical Exam Constitutional:      General: She is not in acute distress.    Appearance: Normal appearance. She is well-developed.  HENT:     Head: Normocephalic and atraumatic.     Right Ear: External ear normal.     Left Ear: External ear normal.  Eyes:     General: No scleral icterus. Neck:     Thyroid: No thyromegaly.  Cardiovascular:     Rate and Rhythm: Normal rate and regular rhythm.     Heart sounds: Normal heart sounds. No murmur heard. Pulmonary:     Effort: Pulmonary effort is normal. No respiratory distress.     Breath sounds: Normal breath sounds. No wheezing.  Musculoskeletal:     Cervical back: Neck supple.  Skin:    General: Skin is warm and dry.  Neurological:     Mental Status: She is alert and oriented to person, place, and time.  Psychiatric:        Mood and Affect: Mood normal.        Behavior: Behavior normal.        Thought Content: Thought content normal.        Judgment: Judgment normal.      BP (!) 153/97   Pulse 95   Temp 98.7 F (37.1 C) (Tympanic)   Resp 16    Ht 5' 2 (1.575 m)   Wt 168 lb (76.2 kg)   SpO2 100%   BMI 30.73 kg/m  Wt Readings from Last 3 Encounters:  03/13/24 168 lb (76.2 kg)  01/22/24 165 lb (74.8 kg)  12/23/23 170 lb (77.1 kg)       Assessment & Plan:   Problem List Items Addressed This Visit       Unprioritized   Strain of right shoulder    possible rotator cuff strain Right shoulder pain with radiation, possibly rotator cuff strain. No recent injury. - Sent refill for meloxicam  7.5 mg oral daily. - Advised to avoid painful activities. - Instructed to follow up if pain does not improve in a few weeks.      Insomnia   Using bendryl prn since she ran out of xanax .  Difficulty sleeping, using Benadryl and Xanax , less effective than previous medication. - Sent refill for Xanax  0.5 mg oral twice daily as needed. -Contract updated, obtain follow up UDS.      Hypokalemia    Previous low potassium levels noted. Not on supplements. Discussed importance of supplementation due to diuretic use and risks of low potassium. - Ordered lab tests to check potassium levels.      Essential hypertension    Blood pressure elevated at 175/105 mmHg. Non-adherence to amlodipine  noted. Discussed importance of resuming amlodipine  and lifestyle modifications. - Sent refill for amlodipine  10 mg oral daily. - Recheck blood pressure in one month. - Monitor blood pressure at home and report if still elevated after resuming amlodipine .      Relevant Medications   amLODipine  (NORVASC ) 10 MG tablet  Depression   Relevant Medications   ALPRAZolam  (XANAX ) 0.5 MG tablet   buPROPion  (WELLBUTRIN  XL) 300 MG 24 hr tablet   Anxiety and depression - Primary   Anxiety situational, exacerbated by stress. Depression symptoms persist. Previous SSRI medications caused nausea. Current treatment includes Wellbutrin  and Xanax . Discussed counseling benefits. - Sent refill for Xanax  0.5 mg oral twice daily as needed. - Sent refill for Wellbutrin  XL  300 mg oral daily. - Provided pamphlet on counseling services.      Relevant Medications   ALPRAZolam  (XANAX ) 0.5 MG tablet   buPROPion  (WELLBUTRIN  XL) 300 MG 24 hr tablet   Other Visit Diagnoses       Acute bilateral low back pain without sciatica       Relevant Medications   meloxicam  (MOBIC ) 15 MG tablet     Fatigue, unspecified type       Relevant Orders   Vitamin D  (25 hydroxy)   B12      Assessment & Plan      I have discontinued Sirenity Gerald's cyclobenzaprine . I am also having her maintain her multivitamin, OVER THE COUNTER MEDICATION, metoprolol  succinate, hydrOXYzine , potassium chloride , hydrochlorothiazide , lidocaine , methocarbamol , amLODipine , ALPRAZolam , buPROPion , and meloxicam .  Meds ordered this encounter  Medications   amLODipine  (NORVASC ) 10 MG tablet    Sig: Take 1 tablet (10 mg total) by mouth daily.    Dispense:  90 tablet    Refill:  1    Supervising Provider:   DOMENICA BLACKBIRD A [[id number]]   ALPRAZolam  (XANAX ) 0.5 MG tablet    Sig: Take 1 tablet (0.5 mg total) by mouth 2 (two) times daily as needed.    Dispense:  30 tablet    Refill:  0    Supervising Provider:   DOMENICA BLACKBIRD A [[id number]]   buPROPion  (WELLBUTRIN  XL) 300 MG 24 hr tablet    Sig: Take 1 tablet (300 mg total) by mouth daily.    Dispense:  90 tablet    Refill:  1    Supervising Provider:   DOMENICA BLACKBIRD A [[id number]]   meloxicam  (MOBIC ) 15 MG tablet    Sig: Take 1 tablet (15 mg total) by mouth daily.    Dispense:  15 tablet    Refill:  0    Supervising Provider:   DOMENICA BLACKBIRD A [[id number]]      [1]  Allergies Allergen Reactions   Cymbalta  [Duloxetine  Hcl]    Lisinopril      cough   "

## 2024-03-13 NOTE — Addendum Note (Signed)
 Addended by: ROSEBUD NEST A on: 03/13/2024 01:14 PM   Modules accepted: Orders

## 2024-03-13 NOTE — Assessment & Plan Note (Signed)
"   possible rotator cuff strain Right shoulder pain with radiation, possibly rotator cuff strain. No recent injury. - Sent refill for meloxicam  7.5 mg oral daily. - Advised to avoid painful activities. - Instructed to follow up if pain does not improve in a few weeks. "

## 2024-03-13 NOTE — Assessment & Plan Note (Addendum)
 Anxiety situational, exacerbated by stress. Depression symptoms persist. Previous SSRI medications caused nausea. Current treatment includes Wellbutrin  and Xanax . Discussed counseling benefits. - Sent refill for Xanax  0.5 mg oral twice daily as needed. - Sent refill for Wellbutrin  XL 300 mg oral daily. - Provided pamphlet on counseling services.

## 2024-03-13 NOTE — Assessment & Plan Note (Signed)
" °  Blood pressure elevated at 175/105 mmHg. Non-adherence to amlodipine  noted. Discussed importance of resuming amlodipine  and lifestyle modifications. - Sent refill for amlodipine  10 mg oral daily. - Recheck blood pressure in one month. - Monitor blood pressure at home and report if still elevated after resuming amlodipine . "

## 2024-03-13 NOTE — Assessment & Plan Note (Addendum)
 Using bendryl prn since she ran out of xanax .  Difficulty sleeping, using Benadryl and Xanax , less effective than previous medication. - Sent refill for Xanax  0.5 mg oral twice daily as needed. -Contract updated, obtain follow up UDS.

## 2024-03-14 ENCOUNTER — Other Ambulatory Visit: Payer: Self-pay

## 2024-03-14 ENCOUNTER — Ambulatory Visit: Payer: Self-pay | Admitting: Family

## 2024-03-14 DIAGNOSIS — E559 Vitamin D deficiency, unspecified: Secondary | ICD-10-CM

## 2024-03-14 DIAGNOSIS — E538 Deficiency of other specified B group vitamins: Secondary | ICD-10-CM

## 2024-03-14 NOTE — Telephone Encounter (Signed)
 Vitamin D  level is low.  Advise patient to begin vit D 50000 units once weekly for 12 weeks, then repeat vit D level (dx Vit D deficiency).    B12 is low. Start b12 injections 1000 mcg IM weekly x 4 then monthly.  Kidney function is mildly reduced.  Good blood pressure control is important to prevent further kidney damage.

## 2024-03-15 ENCOUNTER — Ambulatory Visit

## 2024-03-15 MED ORDER — VITAMIN D (ERGOCALCIFEROL) 1.25 MG (50000 UNIT) PO CAPS: 50000.0000 [IU] | ORAL_CAPSULE | ORAL | 0 refills | Status: AC

## 2024-03-18 ENCOUNTER — Ambulatory Visit

## 2024-03-18 DIAGNOSIS — E559 Vitamin D deficiency, unspecified: Secondary | ICD-10-CM

## 2024-03-18 DIAGNOSIS — E538 Deficiency of other specified B group vitamins: Secondary | ICD-10-CM | POA: Diagnosis not present

## 2024-03-18 MED ORDER — CYANOCOBALAMIN 1000 MCG/ML IJ SOLN
1000.0000 ug | Freq: Once | INTRAMUSCULAR | Status: AC
  Administered 2024-03-18: 1000 ug via INTRAMUSCULAR

## 2024-03-18 NOTE — Progress Notes (Signed)
 Patient is here for weekly B12 injection per original order dated:  03/14/2024 -Per Eleanor Ponto, NP Vitamin D  level is low.  Advise patient to begin vit D 50000 units once weekly for 12 weeks, then repeat vit D level (dx Vit D deficiency).     B12 is low. Start b12 injections 1000 mcg IM weekly x 4 then monthly.     Last B12 injection: N/A - Today  Last B12 level: 242 - 03/13/2024  B12 given in Left Deltoid IM, and patient tolerated injection well.  Next B12 injection scheduled for:  One Week - 2 of 4 on 03/26/2024 at 3:15 pm

## 2024-03-26 ENCOUNTER — Ambulatory Visit

## 2024-03-26 DIAGNOSIS — E538 Deficiency of other specified B group vitamins: Secondary | ICD-10-CM | POA: Diagnosis not present

## 2024-03-26 MED ORDER — CYANOCOBALAMIN 1000 MCG/ML IJ SOLN
1000.0000 ug | Freq: Once | INTRAMUSCULAR | Status: AC
  Administered 2024-03-26: 1000 ug via INTRAMUSCULAR

## 2024-03-26 NOTE — Progress Notes (Signed)
 Pt here for monthly B12 injection per original order dated: 03/14/2024, Melissa O'Sullivan,NP B12 is low. Start b12 injections 1000 mcg IM weekly x 4 then monthly.   Last B12 injection:03/18/2024  Last B12 level: 03/13/2024   B12 1000mcg given IM, left deltoid and pt tolerated injection well.  Next B12 injection scheduled for: 04/02/2024

## 2024-04-02 ENCOUNTER — Ambulatory Visit

## 2024-04-05 ENCOUNTER — Ambulatory Visit

## 2024-04-05 ENCOUNTER — Ambulatory Visit: Payer: Self-pay | Admitting: Family

## 2024-04-12 ENCOUNTER — Ambulatory Visit: Admitting: Family

## 2024-04-17 ENCOUNTER — Ambulatory Visit: Payer: Self-pay | Admitting: Family
